# Patient Record
Sex: Male | Born: 1960 | Race: White | Hispanic: No | Marital: Married | Smoking: Never smoker
Health system: Southern US, Community
[De-identification: ages and names within clinical notes are randomized; demographics above are authoritative.]

## PROBLEM LIST (undated history)

## (undated) DIAGNOSIS — Z79899 Other long term (current) drug therapy: Secondary | ICD-10-CM

## (undated) DIAGNOSIS — I4891 Unspecified atrial fibrillation: Secondary | ICD-10-CM

## (undated) DIAGNOSIS — R0600 Dyspnea, unspecified: Secondary | ICD-10-CM

## (undated) DIAGNOSIS — E785 Hyperlipidemia, unspecified: Secondary | ICD-10-CM

## (undated) DIAGNOSIS — R7302 Impaired glucose tolerance (oral): Secondary | ICD-10-CM

## (undated) DIAGNOSIS — I2510 Atherosclerotic heart disease of native coronary artery without angina pectoris: Secondary | ICD-10-CM

## (undated) DIAGNOSIS — I10 Essential (primary) hypertension: Secondary | ICD-10-CM

## (undated) HISTORY — DX: Hyperlipidemia, unspecified: E78.5

## (undated) HISTORY — DX: Atherosclerotic heart disease of native coronary artery without angina pectoris: I25.10

## (undated) HISTORY — DX: Unspecified atrial fibrillation: I48.91

## (undated) HISTORY — PX: CHOLECYSTECTOMY: SHX55

## (undated) HISTORY — DX: Impaired glucose tolerance (oral): R73.02

## (undated) HISTORY — DX: Essential (primary) hypertension: I10

## (undated) HISTORY — DX: Dyspnea, unspecified: R06.00

## (undated) HISTORY — PX: CARDIAC CATHETERIZATION: SHX172

## (undated) HISTORY — DX: Other long term (current) drug therapy: Z79.899

---

## 2007-05-02 ENCOUNTER — Emergency Department (HOSPITAL_COMMUNITY): Admission: EM | Admit: 2007-05-02 | Discharge: 2007-05-02 | Payer: Self-pay | Admitting: Emergency Medicine

## 2007-05-02 ENCOUNTER — Ambulatory Visit: Payer: Self-pay | Admitting: Internal Medicine

## 2007-05-02 ENCOUNTER — Ambulatory Visit: Payer: Self-pay

## 2007-05-02 LAB — CONVERTED CEMR LAB
CO2: 30 meq/L (ref 19–32)
GFR calc Af Amer: 103 mL/min
GFR calc non Af Amer: 86 mL/min
Glucose, Bld: 96 mg/dL (ref 70–99)
Sodium: 142 meq/L (ref 135–145)

## 2007-05-08 ENCOUNTER — Ambulatory Visit: Payer: Self-pay | Admitting: Internal Medicine

## 2007-05-08 ENCOUNTER — Emergency Department (HOSPITAL_COMMUNITY): Admission: EM | Admit: 2007-05-08 | Discharge: 2007-05-08 | Payer: Self-pay | Admitting: Emergency Medicine

## 2007-05-10 ENCOUNTER — Ambulatory Visit: Payer: Self-pay

## 2007-05-10 ENCOUNTER — Encounter: Payer: Self-pay | Admitting: Internal Medicine

## 2007-07-28 ENCOUNTER — Ambulatory Visit: Payer: Self-pay | Admitting: Internal Medicine

## 2007-08-16 ENCOUNTER — Ambulatory Visit: Admission: RE | Admit: 2007-08-16 | Discharge: 2007-08-16 | Payer: Self-pay | Admitting: Internal Medicine

## 2007-08-16 ENCOUNTER — Ambulatory Visit: Payer: Self-pay | Admitting: Internal Medicine

## 2008-01-17 ENCOUNTER — Encounter: Admission: RE | Admit: 2008-01-17 | Discharge: 2008-01-17 | Payer: Self-pay | Admitting: Gastroenterology

## 2008-01-20 ENCOUNTER — Encounter: Admission: RE | Admit: 2008-01-20 | Discharge: 2008-01-20 | Payer: Self-pay | Admitting: Gastroenterology

## 2008-01-20 ENCOUNTER — Encounter (INDEPENDENT_AMBULATORY_CARE_PROVIDER_SITE_OTHER): Payer: Self-pay | Admitting: *Deleted

## 2008-02-01 ENCOUNTER — Ambulatory Visit (HOSPITAL_COMMUNITY): Admission: RE | Admit: 2008-02-01 | Discharge: 2008-02-01 | Payer: Self-pay | Admitting: Gastroenterology

## 2008-02-21 ENCOUNTER — Ambulatory Visit (HOSPITAL_COMMUNITY): Admission: RE | Admit: 2008-02-21 | Discharge: 2008-02-21 | Payer: Self-pay | Admitting: Gastroenterology

## 2008-02-21 ENCOUNTER — Encounter (INDEPENDENT_AMBULATORY_CARE_PROVIDER_SITE_OTHER): Payer: Self-pay | Admitting: *Deleted

## 2008-03-13 ENCOUNTER — Emergency Department (HOSPITAL_COMMUNITY): Admission: EM | Admit: 2008-03-13 | Discharge: 2008-03-14 | Payer: Self-pay | Admitting: Emergency Medicine

## 2008-04-18 ENCOUNTER — Encounter: Payer: Self-pay | Admitting: Internal Medicine

## 2008-07-05 ENCOUNTER — Emergency Department (HOSPITAL_COMMUNITY): Admission: EM | Admit: 2008-07-05 | Discharge: 2008-07-06 | Payer: Self-pay | Admitting: Emergency Medicine

## 2008-07-16 ENCOUNTER — Ambulatory Visit: Payer: Self-pay | Admitting: Internal Medicine

## 2008-08-09 ENCOUNTER — Telehealth: Payer: Self-pay | Admitting: Internal Medicine

## 2009-01-17 ENCOUNTER — Telehealth: Payer: Self-pay | Admitting: Internal Medicine

## 2009-02-16 ENCOUNTER — Emergency Department (HOSPITAL_COMMUNITY): Admission: EM | Admit: 2009-02-16 | Discharge: 2009-02-16 | Payer: Self-pay | Admitting: Emergency Medicine

## 2009-02-16 ENCOUNTER — Encounter: Payer: Self-pay | Admitting: Internal Medicine

## 2009-03-25 ENCOUNTER — Telehealth: Payer: Self-pay | Admitting: Internal Medicine

## 2009-03-28 ENCOUNTER — Telehealth: Payer: Self-pay | Admitting: Internal Medicine

## 2009-04-22 ENCOUNTER — Ambulatory Visit: Payer: Self-pay | Admitting: Professional

## 2009-04-29 ENCOUNTER — Ambulatory Visit: Payer: Self-pay | Admitting: Professional

## 2009-04-30 ENCOUNTER — Telehealth: Payer: Self-pay | Admitting: Internal Medicine

## 2009-04-30 ENCOUNTER — Ambulatory Visit: Payer: Self-pay | Admitting: Cardiology

## 2009-04-30 DIAGNOSIS — K219 Gastro-esophageal reflux disease without esophagitis: Secondary | ICD-10-CM | POA: Insufficient documentation

## 2009-04-30 DIAGNOSIS — R079 Chest pain, unspecified: Secondary | ICD-10-CM

## 2009-04-30 DIAGNOSIS — R0602 Shortness of breath: Secondary | ICD-10-CM

## 2009-04-30 DIAGNOSIS — I4891 Unspecified atrial fibrillation: Secondary | ICD-10-CM

## 2009-05-01 ENCOUNTER — Encounter: Payer: Self-pay | Admitting: Internal Medicine

## 2009-05-01 ENCOUNTER — Ambulatory Visit: Payer: Self-pay

## 2009-05-03 ENCOUNTER — Telehealth: Payer: Self-pay | Admitting: Internal Medicine

## 2009-05-06 ENCOUNTER — Ambulatory Visit: Payer: Self-pay | Admitting: Professional

## 2009-05-13 ENCOUNTER — Ambulatory Visit: Payer: Self-pay | Admitting: Professional

## 2009-05-16 ENCOUNTER — Telehealth (INDEPENDENT_AMBULATORY_CARE_PROVIDER_SITE_OTHER): Payer: Self-pay | Admitting: *Deleted

## 2009-05-16 ENCOUNTER — Encounter: Payer: Self-pay | Admitting: Internal Medicine

## 2009-05-20 ENCOUNTER — Ambulatory Visit: Payer: Self-pay | Admitting: Professional

## 2009-07-01 ENCOUNTER — Encounter (INDEPENDENT_AMBULATORY_CARE_PROVIDER_SITE_OTHER): Payer: Self-pay | Admitting: *Deleted

## 2009-08-02 ENCOUNTER — Ambulatory Visit: Payer: Self-pay | Admitting: Internal Medicine

## 2009-08-26 ENCOUNTER — Telehealth: Payer: Self-pay | Admitting: Internal Medicine

## 2010-01-17 ENCOUNTER — Encounter: Payer: Self-pay | Admitting: Internal Medicine

## 2010-01-23 ENCOUNTER — Ambulatory Visit: Payer: Self-pay | Admitting: Internal Medicine

## 2010-01-23 DIAGNOSIS — E785 Hyperlipidemia, unspecified: Secondary | ICD-10-CM

## 2010-04-28 ENCOUNTER — Emergency Department (HOSPITAL_COMMUNITY): Admission: EM | Admit: 2010-04-28 | Discharge: 2010-04-28 | Payer: Self-pay | Admitting: Emergency Medicine

## 2010-04-28 ENCOUNTER — Encounter: Admission: RE | Admit: 2010-04-28 | Discharge: 2010-04-28 | Payer: Self-pay | Admitting: Gastroenterology

## 2010-04-30 IMAGING — CR DG CHEST 2V
2 series · 2 of 2 positions shown · non-contrast
Comparison: 05/08/2007

CLINICAL DATA: Post gallbladder surgery today.  Dyspnea.

CHEST - 2 VIEW

[w chest pa]
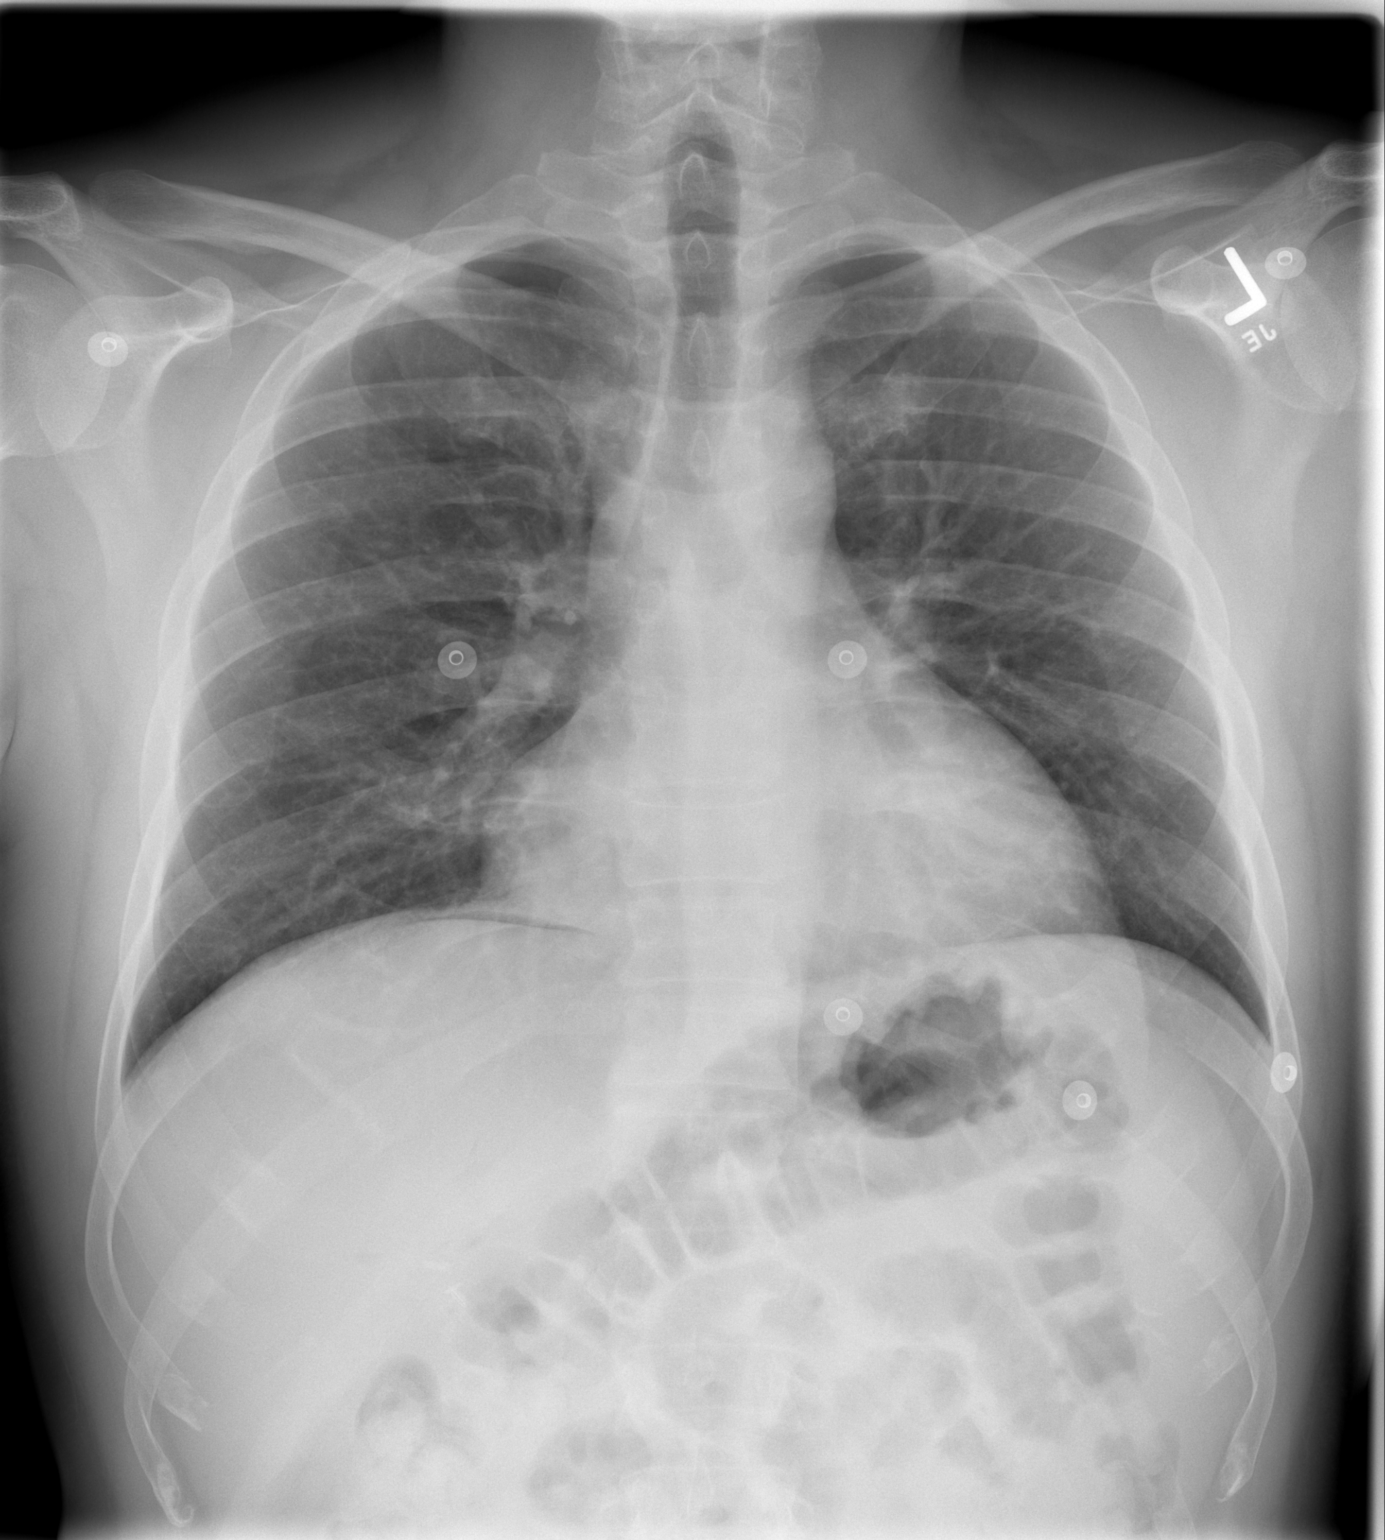

[w chest lat]
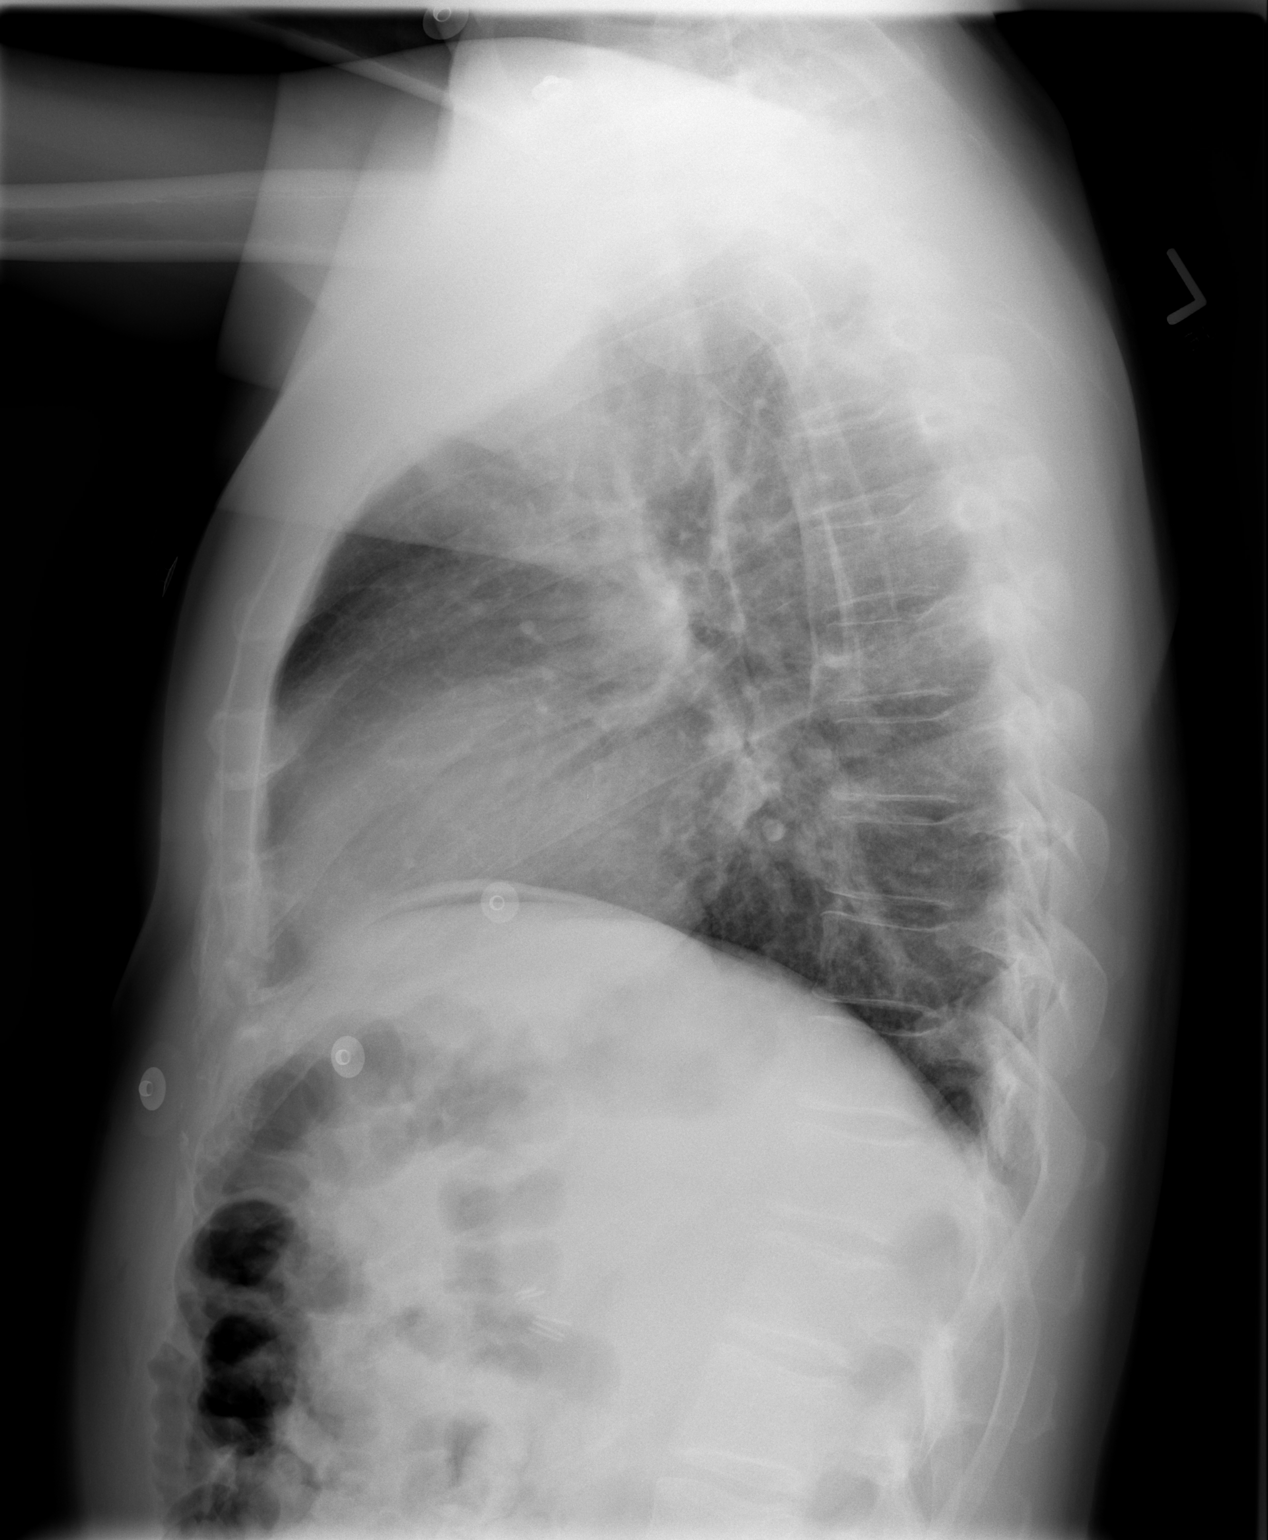

[2 of 2 positions shown; findings below may reference images not displayed]

FINDINGS: Free intraperitoneal air is identified under the right
hemidiaphragm.  This is consistent with the history of gallbladder
surgery earlier today. Midline trachea. Borderline cardiomegaly.
Normal mediastinal contours. No pleural effusion or pneumothorax.
Clear lungs. Bowel gas pattern is normal limits in its visualized
portions.
IMPRESSION: 1.  Borderline cardiomegaly but no acute cardiopulmonary disease.
2.  Free intraperitoneal air, likely related to the history of
gallbladder surgery today.  Clinically correlate.

## 2010-05-21 ENCOUNTER — Encounter: Payer: Self-pay | Admitting: Internal Medicine

## 2010-05-28 ENCOUNTER — Encounter: Payer: Self-pay | Admitting: Internal Medicine

## 2010-06-12 ENCOUNTER — Encounter: Payer: Self-pay | Admitting: Internal Medicine

## 2010-06-18 ENCOUNTER — Ambulatory Visit: Payer: Self-pay | Admitting: Diagnostic Radiology

## 2010-06-18 ENCOUNTER — Ambulatory Visit: Payer: Self-pay | Admitting: Cardiology

## 2010-06-18 ENCOUNTER — Inpatient Hospital Stay (HOSPITAL_COMMUNITY): Admission: EM | Admit: 2010-06-18 | Discharge: 2010-06-20 | Payer: Self-pay | Admitting: Internal Medicine

## 2010-06-20 ENCOUNTER — Encounter: Payer: Self-pay | Admitting: Internal Medicine

## 2010-06-30 ENCOUNTER — Ambulatory Visit: Payer: Self-pay | Admitting: Internal Medicine

## 2010-07-07 ENCOUNTER — Encounter: Payer: Self-pay | Admitting: Internal Medicine

## 2010-07-10 ENCOUNTER — Encounter: Payer: Self-pay | Admitting: Internal Medicine

## 2010-08-01 ENCOUNTER — Encounter: Payer: Self-pay | Admitting: Physician Assistant

## 2010-08-08 ENCOUNTER — Ambulatory Visit: Payer: Self-pay | Admitting: Cardiology

## 2010-08-08 ENCOUNTER — Encounter: Payer: Self-pay | Admitting: Physician Assistant

## 2010-08-08 DIAGNOSIS — E739 Lactose intolerance, unspecified: Secondary | ICD-10-CM

## 2010-08-08 DIAGNOSIS — I2510 Atherosclerotic heart disease of native coronary artery without angina pectoris: Secondary | ICD-10-CM | POA: Insufficient documentation

## 2010-09-01 ENCOUNTER — Telehealth: Payer: Self-pay | Admitting: Internal Medicine

## 2010-09-04 ENCOUNTER — Telehealth (INDEPENDENT_AMBULATORY_CARE_PROVIDER_SITE_OTHER): Payer: Self-pay | Admitting: *Deleted

## 2010-09-05 ENCOUNTER — Encounter: Payer: Self-pay | Admitting: Internal Medicine

## 2010-09-09 ENCOUNTER — Telehealth (INDEPENDENT_AMBULATORY_CARE_PROVIDER_SITE_OTHER): Payer: Self-pay | Admitting: *Deleted

## 2010-09-13 ENCOUNTER — Encounter: Payer: Self-pay | Admitting: Internal Medicine

## 2010-09-18 ENCOUNTER — Telehealth: Payer: Self-pay | Admitting: Internal Medicine

## 2010-10-01 ENCOUNTER — Ambulatory Visit: Payer: Self-pay | Admitting: Internal Medicine

## 2010-10-15 ENCOUNTER — Encounter (INDEPENDENT_AMBULATORY_CARE_PROVIDER_SITE_OTHER): Payer: Self-pay | Admitting: *Deleted

## 2010-10-28 NOTE — Assessment & Plan Note (Signed)
Summary: EPH/JML   Primary Provider:  [facility] [address]  CC:  eph/ pt states that he is doing well.  Pt feels all issues have resolved and he is here just to follow up. Marland Kitchen  History of Present Illness: Terry Guzman he is a 50 year old male with a history of atrial fibrillation on flecainide therapy who was recently admitted with chest pain.  He ruled out for myocardial infarction.  His cardiac catheterization showed mild, nonobstructive disease.  His ejection fraction was normal.  He returns today for post hospitalization followup.  Since discharge, he denies chest pain, dyspnea, orthopnea, PND, pedal edema or syncope.  The patient describes NYHA Class 1 symptoms.   Current Medications (verified): 1)  Metoprolol Succinate 25 Mg Xr24h-Tab (Metoprolol Succinate) .... Take One Tablet By Mouth Daily 2)  Flecainide Acetate 50 Mg Tabs (Flecainide Acetate) .... Take One Tablet By Mouth Every 12 Hours 3)  Nexium 40 Mg Cpdr (Esomeprazole Magnesium) .Marland Kitchen.. 1 Tab Once Daily 4)  Pravastatin Sodium 40 Mg Tabs (Pravastatin Sodium) .... Take One Tablet By Mouth Daily At Bedtime 5)  Fish Oil 1200 Mg Caps (Omega-3 Fatty Acids) .... Take 1 Capsule By Mouth Two Times A Day 6)  Aspirin 81 Mg Tbec (Aspirin) .... Take One Tablet By Mouth Daily  Allergies (verified): 1)  ! Levaquin 2)  ! * Clarithromycin  Past History:  Past Medical History: CHEST PAIN-UNSPECIFIED (ICD-786.50)   a.  [facility] 05/2010:  Cath with nonobs CAD (LAD 40%/30%); Normal LVF - EF > 50% ATRIAL FIBRILLATION (ICD-427.31)   a.  Flecainide therapy Dyspnea Hyperlipidemia Glucose intolerant (A1C 6.2 in hosp in 05/2010)  Past Surgical History: cholecystectomy cardiac cath 05/2010  Vital Signs:  Patient profile:   50 year old male Height:      67 inches Weight:      200 pounds Temp:     97 degrees F oral Pulse rate:   69 / minute Pulse rhythm:   regular BP sitting:   129 / 79  (left arm) Cuff size:   regular  Vitals  Entered By: Judithe Modest CMA (August 08, 2010 10:28 AM)  Physical Exam  General:  Well nourished, well developed, in no acute distress HEENT: normal Neck: no JVD Cardiac:  normal S1, S2; RRR; no murmur Lungs:  clear to auscultation bilaterally, no wheezing, rhonchi or rales Abd: soft, nontender, no hepatomegaly Ext: no edema; right radial site without hematoma or bruit Vascular: no carotid  bruits Skin: warm and dry Neuro:  CNs 2-12 intact, no focal abnormalities noted    EKG  Procedure date:  08/08/2010  Findings:      Normal sinus rhythm Heart rate 69 Normal axis No ischemic changes  Impression & Recommendations:  Problem # 1:  CORONARY ATHEROSCLEROSIS NATIVE CORONARY ARTERY (ICD-414.01) Nonobstructive disease on recent cath. Continue risk factor modification. Continue ASA.  Problem # 2:  ATRIAL FIBRILLATION (ICD-427.31)  Continue on Flecainide and Toprol.  Problem # 3:  HYPERLIPIDEMIA-MIXED (ICD-272.4)  Going to Lipid clinic. No myalgias with pravastatin. Just had labs drawn . . no results in system yet (drawn at PCPs office).  Problem # 4:  CHEST PAIN-UNSPECIFIED (ICD-786.50) Resolved. Noncardiac.  Problem # 5:  GLUCOSE INTOLERANCE (ICD-271.3) F/u with PCP   Appended Document: [facility]     Allergies: 1)  ! Levaquin 2)  ! * Clarithromycin   Other Orders: EKG w/ Interpretation (93000)

## 2010-10-28 NOTE — Progress Notes (Signed)
Summary: Faxed records to [facility].  Faxed records to [facility]. LOV & EKG Fax: [phone] Phone: [phone] Marylou Mccoy  September 04, 2010 12:44 PM

## 2010-10-28 NOTE — Progress Notes (Signed)
Summary: questions about meds  Phone Note Call from Patient Call back at Home Phone [phone] Call back at Work Phone [phone]   Caller: Patient Summary of Call: Pt calling with questions about medication Initial call taken by: Judie Grieve,  September 01, 2010 9:47 AM  Follow-up for Phone Call        [facility] at work for call back.  Layne Benton, RN, BSN  September 01, 2010 11:52 AM  PT Nicoletta Dress -[place] CALL [phone] Glynda Jaeger  September 01, 2010 12:17 PPt returning call Judie Grieve  September 01, 2010 1:26 PM  Additional Follow-up for Phone Call Additional follow up Details #1::        Called patient back and he states that for a long time he has been having an increased heart rate and shortness of breath when walking up a hill. He states that he thinks his heart rate goes at about 100 per minute and is not sure if he goes out of rhythm. He states that he read the possible side effects of Flecainide and both SOB and increasd heart rate are listed. Advised I would discuss with Dr.Ross and call him back, he states that he has not used a monitor in over 1 year.     Layne Benton, RN, BSN  September 01, 2010 4:16 PM     Additional Follow-up for Phone Call Additional follow up Details #2::    Called patient in follow up and [facility] that Dr.Ross is discussing the above issues with Dr.Taylor and then we will get back with him.  Layne Benton, RN, BSN  September 03, 2010 5:46 PM   Additional Follow-up for Phone Call Additional follow up Details #3:: Details for Additional Follow-up Action Taken: Set up for event monitor. Additional Follow-up by: Sherrill Raring, MD, [facility],  September 04, 2010 10:24 PM   Appended Document: questions about meds [facility] with above information.

## 2010-10-28 NOTE — Letter (Signed)
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

## 2010-10-28 NOTE — Assessment & Plan Note (Signed)
Summary: ROV/CHEST PAINS EKG CHANGES-MB   Visit Type:  Follow-up Primary Provider:  [facility] [address]  CC:  sob-chol is up-.  History of Present Illness:  Mr. Bodie is a 50 year old with a history of intermittent atrial fibrillation and shortness of breath. I last saw him in clinic in the winter.   [facility]  he told them about some dyspnea after exertion and they recommended that he f/u with his cardiologist. He reports that he has not been running for the past 4 months because of an injury.  He started back about 1 1/2 wks ago.  He is running about 2 miles.  He says at the end he feels more short of breath than he has been in the past.  He's actually felt a little weak kneed but has not passed out.  No chest pain.  No problems that have stopped him during the run.   In regards to his atrial fibrillation he has not had any problems with palpitations. Note that [facility].  LDL was 143, HDL was 42.  trig were 126.  this was not significantly changed from 1 year ago per his reprot.  He has tried to [employer] his diet.  On talking to him he does not appear to eat much fat.  The [facility] physician has asked him to come back today to discuss the results.  Current Medications (verified): 1)  Metoprolol Succinate 25 Mg Xr24h-Tab (Metoprolol Succinate) .... Take One Tablet By Mouth Daily 2)  Flecainide Acetate 50 Mg Tabs (Flecainide Acetate) .... Take One Tablet By Mouth Every 12 Hours 3)  Nexium 40 Mg Cpdr (Esomeprazole Magnesium) .Marland Kitchen.. 1 Tab Once Daily  Allergies (verified): No Known Drug Allergies  Past History:  Family History: Last updated: 04/30/2009  Paternal grandfathers on both side had coronary artery   disease and died in their 17's.   Social History: Last updated: 04/30/2009  1. He works for the [employer] and recently relocated to [place].   2. Does not smoke.   3. Drinks one alcoholic beverage per day.   4. No  drugs.      Past medical, surgical, family and social histories (including risk factors) reviewed, and no changes noted (except as noted below).  Past Medical History: Reviewed history from 08/02/2009 and no changes required. CHEST PAIN-UNSPECIFIED (ICD-786.50) ATRIAL FIBRILLATION (ICD-427.31) Dyspnea  Past Surgical History: Reviewed history from 04/30/2009 and no changes required. cholecystectomy  Family History: Reviewed history from 04/30/2009 and no changes required.  Paternal grandfathers on both side had coronary artery   disease and died in their 71's.   Social History: Reviewed history from 04/30/2009 and no changes required.  1. He works for the [employer] and recently relocated to [place].   2. Does not smoke.   3. Drinks one alcoholic beverage per day.   4. No drugs.      Review of Systems       All systmes reviewed.  Did bring in labs as noted.  Vital Signs:  Patient profile:   50 year old male Height:      67 inches Weight:      196 pounds Pulse rate:   56 / minute BP sitting:   123 / 80  (left arm) Cuff size:   regular  Vitals Entered By: Burnett Kanaris, CNA (January 23, 2010 2:07 PM)  Physical Exam  Additional Exam:  Patient is in NAD. HEENT:  Normocephalic, atraumatic. EOMI, PERRLA.  Neck: JVP is normal. No thyromegaly. No bruits.  Lungs: clear to auscultation. No rales no wheezes.  Heart: Regular rate and rhythm. Normal S1, S2. No S3.   No significant murmurs. PMI not displaced.  Abdomen:  Supple, nontender. Normal bowel sounds. No masses. No hepatomegaly.  Extremities:   Good distal pulses throughout. No lower extremity edema.  Musculoskeletal :moving all extremities.  Neuro:   alert and oriented x3.    EKG  Procedure date:  01/23/2010  Findings:      NSR>  62 bpm.  Impression & Recommendations:  Problem # 1:  CHEST PAIN-UNSPECIFIED (ICD-786.50) Patient is not having chest pain.  I do not think his shortness of breath is an anginal  equivalent.  I do instead think it is due to some deconditioning.  I have encouraged him to back off some and slow down.  If, in a couple months he does not have improvement will reassess.  Note that the patient has had SOB with stairs even at times when he was training for a marathon.   He had an normal cath in 2002 and a normal myoview in 2008.  Also evaluted in [place] since.  Problem # 2:  ATRIAL FIBRILLATION (ICD-427.31) Doing well on current regimen. His updated medication list for this problem includes:    Metoprolol Succinate 25 Mg Xr24h-tab (Metoprolol succinate) .Marland Kitchen... Take one tablet by mouth daily    Flecainide Acetate 50 Mg Tabs (Flecainide acetate) .Marland Kitchen... Take one tablet by mouth every 12 hours  Orders: EKG w/ Interpretation (93000)  Problem # 3:  HYPERLIPIDEMIA-MIXED (ICD-272.4) Reasonable with family history to start a statin.  I would recomm Zocor 20 mg.  Labs in 8 wks.  [facility] to [facility] as he would get Rx and labs there.

## 2010-10-28 NOTE — Miscellaneous (Signed)
  Clinical Lists Changes  Observations: Added new observation of CARDCATHFIND: CONCLUSIONS: 1. Well-preserved left ventricular function with ejection fraction in     excess of 50%. 2. Mild coronary irregularities with about 20-30% ostial narrowing of     the circumflex, and some mid LAD abnormalities just at the     bifurcation as noted above.   PLAN:  The patient will be taken to the 2500 unit for convalescent care where he will be ambulated.  Further evaluation per Dr. Daleen Squibb.     (06/20/2010 10:07)      Cardiac Cath  Procedure date:  06/20/2010  Findings:      CONCLUSIONS: 1. Well-preserved left ventricular function with ejection fraction in     excess of 50%. 2. Mild coronary irregularities with about 20-30% ostial narrowing of     the circumflex, and some mid LAD abnormalities just at the     bifurcation as noted above.   PLAN:  The patient will be taken to the 2500 unit for convalescent care where he will be ambulated.  Further evaluation per Dr. Daleen Squibb.

## 2010-10-28 NOTE — Miscellaneous (Signed)
  Clinical Lists Changes  Orders: Added new Referral order of Lipid Clinic (Lipid Clinic) - Signed Added new Referral order of Misc. Referral (Misc. Ref) - Signed

## 2010-10-28 NOTE — Miscellaneous (Signed)
  Clinical Lists Changes  Orders: Added new Referral order of Lipid Clinic (Lipid Clinic) - Signed

## 2010-10-28 NOTE — Medication Information (Signed)
Summary: Physician's Orders   Physician's Orders   Imported By: Roderic Ovens 07/17/2010 10:15:25  _____________________________________________________________________  External Attachment:    Type:   Image     Comment:   External Document

## 2010-10-28 NOTE — Letter (Signed)
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

## 2010-10-28 NOTE — Assessment & Plan Note (Signed)
Summary: lipid/272.4/saf   Visit Type:  Initial Consult Primary Provider:  [facility] [address]   History of Present Illness:       This is a 50 year old male who presents for evaluation in Lipid Clinic.  The patient presents with history of hyperlipidemia, intolerance to multiple cholesterol medications, and initial cholesterol value of: TC- 252, TG- 179, HDL-46, and LDL- 170 (labs drawn 9/1).  Previous lipid medications have included Crestor and pravastatin- muscle pains, Zetia- diarrhea, and Tricor.  Of note, he was hospitalized recently for CP.  Cardiac Cath showed non-obstructive CAD.  His cholesterol panel at that time showed TC- 188, TG- 156, HDL-32 and LDL 126.  He had been on Tricor about 2 weeks prior to this hospitalization.  At discharge, he was changed to pravastatin 40mg  again.  He has tolerated this for about a week with no muscle pains.  Pertinent family history includes dad with MI at 57 and grandparents and uncles with heart disease in their 86s.         Review of diet and exercise habits reveals that the patient is limiting fats and TFA's and exercising 3-5 times a week.  He does not eat breakfast on a regular basis but will eat eggs and hashbrowns once a week.  For lunch he often eats [place] or chicken sandwiches on rye or wheat bread.  Dinner consists of grilled/baked chicken, pork or fish.  He cannot eat red meat because it upsets his stomach.  He does eat lots of vegetables but unable to tolerate ones high in fiber such as brocolli or calliflower due to upset stomach.  He snacks on potatoe chips occassionally.  He has had his gallbladder removed and is lactacte intolerate.  He did admit that his wife was out of town a lot in August so he ate more fast foods.  Pt is consistently exercising.  He was a marathon runner in the past but cannot tolerate exercising as much now due to the afib.  He started running again about 1 month ago.  He runs 2-3 miles (9-10 min miles) 3-4 times a  week.  He does not smoke or drink.    Lipid Management Provider  Weston Brass, PharmD  Preventive Screening-Counseling & Management  Alcohol-Tobacco     Alcohol drinks/day: 0     Smoking Status: never  Current Medications (verified): 1)  Metoprolol Succinate 25 Mg Xr24h-Tab (Metoprolol Succinate) .... Take One Tablet By Mouth Daily 2)  Flecainide Acetate 50 Mg Tabs (Flecainide Acetate) .... Take One Tablet By Mouth Every 12 Hours 3)  Nexium 40 Mg Cpdr (Esomeprazole Magnesium) .Marland Kitchen.. 1 Tab Once Daily 4)  Pravastatin Sodium 40 Mg Tabs (Pravastatin Sodium) .... Take One Tablet By Mouth Daily At Bedtime 5)  Fish Oil 1200 Mg Caps (Omega-3 Fatty Acids) .... Take 1 Capsule By Mouth Two Times A Day 6)  Aspirin 81 Mg Tbec (Aspirin) .... Take One Tablet By Mouth Daily  Allergies: No Known Drug Allergies  Past History:  Past Medical History: Last updated: 08/02/2009 CHEST PAIN-UNSPECIFIED (ICD-786.50) ATRIAL FIBRILLATION (ICD-427.31) Dyspnea  Family History: Last updated: 04/30/2009  Paternal grandfathers on both side had coronary artery   disease and died in their 24's.   Social History: Alcohol drinks/day:  0   Vital Signs:  Patient profile:   50 year old male Weight:      190.50 pounds BMI:     29.94 Pulse rate:   60 / minute BP sitting:  110 / 80  Impression & Recommendations:  Problem # 1:  HYPERLIPIDEMIA-MIXED (ICD-272.4) Assessment New Pt cholesterol panel from hospital: TC- 188 (goal<200), TG- 152 (goal<150), HDL- 32 (goal>40), and LDL- 126 (goal<70).  Pt has a long history of intolerance to medications.  Discussed importance of keeping LDL at goal given his hx of CP and CAD.  Pt is willing to continue to try pravastatin at this time.  Suggested he try CoQ 10 if he starts to experience any muscle pains.  He will continue fish oil 1200mg  two times a day as well.  His diet is very low in fats and carbohydrates.  TG may have been slightly increased due to increase in fast  foods.  Encouraged him to continue to exercise.  Will recheck lipid panel in 1 month.    His updated medication list for this problem includes:    Pravastatin Sodium 40 Mg Tabs (Pravastatin sodium) .Marland Kitchen... Take one tablet by mouth daily at bedtime  Patient Instructions: 1)  Continue Pravastatin 40mg  once daily. 2)  If you have muscle pains, you can try Coenzyme Q 10 that is over the counter.  3)  Continue low-fat, low-cholesterol diet 4)  Continue to exercise at least 30 minutes most days of the week 5)  We will call you about your labwork 6)  Lipid Clinic Appt: 08/04/10 at 4pm

## 2010-10-30 NOTE — Miscellaneous (Signed)
  Clinical Lists Changes  Orders: Added new Referral order of Event (Event) - Signed

## 2010-10-30 NOTE — Progress Notes (Signed)
Summary: refill med  Phone Note Refill Request Message from:  Patient on September 18, 2010 3:30 PM  Refills Requested: Medication #1:  METOPROLOL SUCCINATE 25 MG XR24H-TAB Take one tablet by mouth daily [facility] [place] [phone] [phone]   Method Requested: Fax to Local Pharmacy Initial call taken by: Lorne Skeens,  September 18, 2010 3:30 PM  Follow-up for Phone Call        RX sent into pharmacy. Pt notified.  Marrion Coy, CNA  September 18, 2010 4:08 PM  Follow-up by: Marrion Coy, CNA,  September 18, 2010 4:08 PM    Prescriptions: METOPROLOL SUCCINATE 25 MG XR24H-TAB (METOPROLOL SUCCINATE) Take one tablet by mouth daily  #30 Tablet x 6   Entered by:   Marrion Coy, CNA   Authorized by:   Sherrill Raring, MD, [facility]   Signed by:   Marrion Coy, CNA on 09/18/2010   Method used:   [facility] to        [facility]  [address] [phone][facility] (retail)       [address] [place], [place]  [address]       Ph: [phone] or [phone]       Fax: [phone]   RxID:   [id number]

## 2010-10-30 NOTE — Progress Notes (Addendum)
Summary: monitor  Phone Note Outgoing Call   Call placed by: Marcos Eke,  September 09, 2010 8:50 AM Summary of Call: Call patient for monitor,and patient would like monitor mail out to him Initial call taken by: Marcos Eke,  September 09, 2010 8:51 AM     Appended Document: monitor LMOM at private work phone that event monitor showed no atrial fib. Keep appointment with Dr.Ross on 11/14/2010.

## 2010-10-30 NOTE — Letter (Signed)
Summary: Appointment - Reschedule  [facility], Main Office  [address]   [place], [place] [address]   Phone: [phone]  Fax: [phone]     October 15, 2010 MRN: [id number]   RYAN OGBORN [address] [place], [place]  [address]   Dear Mr. ASPER,   Due to a change in our office schedule, your appointment on 11/13/10  at  3:30 must be changed.  It is very important that we reach you to reschedule this appointment. We look forward to participating in your health care needs. Please contact us at the number listed above at your earliest convenience to reschedule this appointment.     Sincerely, Control and instrumentation engineer

## 2010-11-13 NOTE — Procedures (Signed)
Summary: Summary Report  Summary Report   Imported By: Erle Crocker 11/06/2010 15:41:02  _____________________________________________________________________  External Attachment:    Type:   Image     Comment:   External Document

## 2010-11-14 ENCOUNTER — Ambulatory Visit: Payer: Self-pay | Admitting: Internal Medicine

## 2010-12-11 LAB — BASIC METABOLIC PANEL
Chloride: 103 mEq/L (ref 96–112)
GFR calc Af Amer: >60 mL/min (ref >60)
GFR calc Af Amer: >60 mL/min (ref >60)
GFR calc non Af Amer: >60 mL/min (ref >60)
GFR calc non Af Amer: >60 mL/min (ref >60)
Potassium: 3.6 mEq/L (ref 3.5–5.1)
Potassium: 4.2 mEq/L (ref 3.5–5.1)
Sodium: 138 mEq/L (ref 135–145)

## 2010-12-11 LAB — CARDIAC PANEL(CRET KIN+CKTOT+MB+TROPI)
Relative Index: 0.6 (ref 0.0–2.5)
Relative Index: 1.2 (ref 0.0–2.5)
Total CK: 170 U/L (ref 7–232)
Troponin I: 0.01 ng/mL (ref 0.00–0.06)
Troponin I: 0.01 ng/mL (ref 0.00–0.06)

## 2010-12-11 LAB — COMPREHENSIVE METABOLIC PANEL
ALT: 13 U/L (ref 0–53)
AST: 25 U/L (ref 0–37)
Albumin: 3.7 g/dL (ref 3.5–5.2)
BUN: 9 mg/dL (ref 6–23)
CO2: 28 mEq/L (ref 19–32)
Chloride: 106 mEq/L (ref 96–112)
GFR calc non Af Amer: >60 mL/min (ref >60)
Potassium: 4.1 mEq/L (ref 3.5–5.1)
Sodium: 140 mEq/L (ref 135–145)
Total Bilirubin: 1.1 mg/dL (ref 0.3–1.2)
Total Protein: 6.1 g/dL (ref 6.0–8.3)

## 2010-12-11 LAB — DIFFERENTIAL
Eosinophils Relative: 2 % (ref 0–5)
Lymphocytes Relative: 31 % (ref 12–46)
Lymphs Abs: 1.9 10*3/uL (ref 0.7–4.0)
Neutro Abs: 3.7 10*3/uL (ref 1.7–7.7)

## 2010-12-11 LAB — LIPID PANEL
Cholesterol: 188 mg/dL (ref 0–200)
HDL: 32 mg/dL — ABNORMAL LOW (ref >39)
LDL Cholesterol: 126 mg/dL — ABNORMAL HIGH (ref 0–99)
Total CHOL/HDL Ratio: 5.9 RATIO
Triglycerides: 152 mg/dL — ABNORMAL HIGH (ref <150)

## 2010-12-11 LAB — CBC
HCT: 45.4 % (ref 39.0–52.0)
MCH: 29.7 pg (ref 26.0–34.0)
MCHC: 35.7 g/dL (ref 30.0–36.0)
MCV: 82.9 fL (ref 78.0–100.0)
MCV: 85.9 fL (ref 78.0–100.0)
Platelets: 285 10*3/uL (ref 150–400)
RBC: 5.16 MIL/uL (ref 4.22–5.81)
RBC: 5.28 MIL/uL (ref 4.22–5.81)
WBC: 5.8 10*3/uL (ref 4.0–10.5)
WBC: 6.1 10*3/uL (ref 4.0–10.5)

## 2010-12-11 LAB — POCT CARDIAC MARKERS
CKMB, poc: <1 ng/mL — ABNORMAL LOW (ref 1.0–8.0)
Troponin i, poc: <0.05 ng/mL (ref 0.00–0.09)

## 2010-12-11 LAB — D-DIMER, QUANTITATIVE: D-Dimer, Quant: <0.22 ug/mL-FEU (ref 0.00–0.48)

## 2010-12-11 LAB — HEMOGLOBIN A1C: Hgb A1c MFr Bld: 6.2 % — ABNORMAL HIGH (ref <5.7)

## 2010-12-12 LAB — BASIC METABOLIC PANEL
BUN: 8 mg/dL (ref 6–23)
Calcium: 9.6 mg/dL (ref 8.4–10.5)
Creatinine, Ser: 1.21 mg/dL (ref 0.4–1.5)
GFR calc non Af Amer: >60 mL/min (ref >60)
Glucose, Bld: 100 mg/dL — ABNORMAL HIGH (ref 70–99)
Potassium: 4.1 mEq/L (ref 3.5–5.1)

## 2010-12-12 LAB — DIFFERENTIAL
Basophils Absolute: 0 10*3/uL (ref 0.0–0.1)
Basophils Relative: 0 % (ref 0–1)
Lymphocytes Relative: 26 % (ref 12–46)
Neutro Abs: 3.3 10*3/uL (ref 1.7–7.7)
Neutrophils Relative %: 69 % (ref 43–77)

## 2010-12-12 LAB — URINALYSIS, ROUTINE W REFLEX MICROSCOPIC
Bilirubin Urine: NEGATIVE
Ketones, ur: NEGATIVE mg/dL
Nitrite: NEGATIVE
Protein, ur: NEGATIVE mg/dL
Urobilinogen, UA: 0.2 mg/dL (ref 0.0–1.0)
pH: 7 (ref 5.0–8.0)

## 2010-12-12 LAB — CBC
MCHC: 34.8 g/dL (ref 30.0–36.0)
Platelets: 252 10*3/uL (ref 150–400)
RDW: 12.6 % (ref 11.5–15.5)
WBC: 4.8 10*3/uL (ref 4.0–10.5)

## 2010-12-15 ENCOUNTER — Ambulatory Visit: Payer: Self-pay | Admitting: Internal Medicine

## 2011-01-06 LAB — DIFFERENTIAL
Basophils Absolute: 0 10*3/uL (ref 0.0–0.1)
Basophils Relative: 0 % (ref 0–1)
Eosinophils Absolute: 0 10*3/uL (ref 0.0–0.7)
Monocytes Absolute: 0.6 10*3/uL (ref 0.1–1.0)
Monocytes Relative: 6 % (ref 3–12)
Neutro Abs: 9 10*3/uL — ABNORMAL HIGH (ref 1.7–7.7)

## 2011-01-06 LAB — COMPREHENSIVE METABOLIC PANEL
ALT: 133 U/L — ABNORMAL HIGH (ref 0–53)
Albumin: 4.4 g/dL (ref 3.5–5.2)
Alkaline Phosphatase: 141 U/L — ABNORMAL HIGH (ref 39–117)
BUN: 9 mg/dL (ref 6–23)
Chloride: 102 mEq/L (ref 96–112)
Glucose, Bld: 105 mg/dL — ABNORMAL HIGH (ref 70–99)
Potassium: 4.5 mEq/L (ref 3.5–5.1)
Sodium: 139 mEq/L (ref 135–145)
Total Bilirubin: 2.1 mg/dL — ABNORMAL HIGH (ref 0.3–1.2)
Total Protein: 7.6 g/dL (ref 6.0–8.3)

## 2011-01-06 LAB — CBC
HCT: 48.8 % (ref 39.0–52.0)
Hemoglobin: 16.8 g/dL (ref 13.0–17.0)
RDW: 12.8 % (ref 11.5–15.5)
WBC: 10.7 10*3/uL — ABNORMAL HIGH (ref 4.0–10.5)

## 2011-01-24 ENCOUNTER — Encounter: Payer: Self-pay | Admitting: Internal Medicine

## 2011-01-26 ENCOUNTER — Ambulatory Visit: Payer: Self-pay | Admitting: Internal Medicine

## 2011-02-05 ENCOUNTER — Other Ambulatory Visit: Payer: Self-pay | Admitting: Cardiovascular Disease

## 2011-02-10 NOTE — Consult Note (Signed)
Terry Guzman, Terry Guzman           ACCOUNT NO.:  [id number]   MEDICAL RECORD NO.:  [id number]          PATIENT TYPE:  EMS   LOCATION:  ED                           FACILITY:  [facility]   PHYSICIAN:  Bevelyn Buckles. Bensimhon, MDDATE OF BIRTH:  07/13/1961   DATE OF CONSULTATION:  05/08/2007  DATE OF DISCHARGE:                                 CONSULTATION   CARDIOLOGIST:  Dr. Dietrich Pates.   REASON FOR CONSULT:  Dyspnea on exertion and chest pain.   HISTORY OF PRESENT ILLNESS:  Terry Guzman is a 50 year old male runner  with a history of paroxysmal atrial fibrillation and gastroesophageal  reflux disease.  Apparently, he underwent routine stress test in 2002 in  [place] in which they saw some mild abnormality and subsequently  underwent cardiac catheterization in which he had clean coronary  arteries.  In 2006, he developed atrial fibrillation during a marathon  and was converted with an IV medication.  In April of 2007, again  he  had another episode of atrial fibrillation and he was started on  flecainide and Toprol.  He has done well since that time.   He recently transferred jobs from [place] and moved to [place] on  June 1st.  Over the past month or two he has noticed dyspnea on  exertion.  He says that any time he tries to go up steps he feels that  he is winded and that his heart rate recovers slowly.  He also notes  occasional episodes of a pressure or funny-like feeling in his left  shoulder and arm.  He has also tried to run and he said that he usually  runs 5 miles but now he feels himself quite winded after 3 miles.   He saw Dr. Tenny Craw in the office on the 4th of August and she checked some  labs including a TSH which was normal as well as an EKG.  She increased  his Nexium to b.i.d. and was going to put a cardiac monitor on him.  However, he has continued to have shortness of breath with walking up  steps and so he presented to the ER for evaluation.  He is not having  any rest  symptoms.   REVIEW OF SYSTEMS:  He has had 3 days of diarrhea, which is now  improving, and also had some chills this morning but no cough or  productive sputum.  He has not had any lower extremity edema, orthopnea  or PND.  He has not noticed any melena or bright red blood per rectum.  The remainder of the review of systems is negative except for HPI and  Problem List.   PROBLEM LIST:  1. Paroxysmal atrial fibrillation, maintained on flecainide and      Toprol.  2. History of normal coronary arteries by catheterization in 2002.  3. Gastroesophageal reflux disease.   CURRENT MEDICATIONS:  1. Flecainide 50 b.i.d.  2. Nexium 40 b.i.d.  3. Toprol-XL 25 a day.   ALLERGIES:  No known drug allergies.   FAMILY HISTORY:  Paternal grandfathers on both side had coronary artery  disease and died in their  60's.   SOCIAL HISTORY:  1. He works for the [employer] and recently relocated to [place].  2. Does not smoke.  3. Drinks one alcoholic beverage per day.  4. No drugs.   PHYSICAL EXAM:  He is a bit anxious appearing but otherwise in no acute  distress.  Blood pressure is 118/71, heart rate 64, his respiratory rate  is 15, the respirations are unlabored.  He is sating 98% on room air.  HEENT:  Normal.  NECK:  Supple, no JVD.  Carotids are 2+ bilaterally without any bruits.  There is no lymphadenopathy or thyromegaly.  CARDIAC:  He is bradycardic and regular with no murmurs, rubs or  gallops.  The PMI is normal.  LUNGS:  Clear.  ABDOMEN:  Soft, nontender, nondistended.  No hepatosplenomegaly, no  bruits, no masses.  Good bowel sounds.  EXTREMITIES:  Warm with no cyanosis, clubbing or edema, good distal  pulses.  No rash.  NEURO:  He is alert and oriented x3.  Cranial nerves II-XII are intact,  moves all 4 extremities without difficulty.  Affect, as I said, is a bit  anxious but otherwise normal.   Cardiac markers show a CK MB of less than 1, myoglobin of 34 and  troponin I of less  than 0.05.  EKG shows sinus bradycardia at a rate of  57 with no ST-T wave abnormalities.  TSH is normal.  BMET is normal with  a potassium of 4.6 and a creatinine of 1.   ASSESSMENT:  1. Dyspnea on exertion and chest pain without any overt evidence of      ischemia or congestive heart failure.  2. History of gastroesophageal reflux disease.  3. History of normal coronary arteries by catheterization in 2002.  4. History of paroxysmal atrial fibrillation.   PLAN:  I am not sure what the cause of his symptoms are.  We will check  a BNP, a CBC and a D-dimer and another set of cardiac enzymes.  If these  are negative, we will discharge him home with an outpatient Myoview and  echocardiogram in the next 24-48 hours.  I told him if he develops  resting symptoms he is to report back to the emergency room for  admission and further evaluation.  I have also recommended that he start  baby aspirin.      Bevelyn Buckles. Bensimhon, MD  Electronically Signed     DRB/MEDQ  D:  05/08/2007  T:  05/08/2007  Job:  [id number]

## 2011-02-10 NOTE — Assessment & Plan Note (Signed)
[facility]                            CARDIOLOGY OFFICE NOTE   NAME:Terry Guzman, Terry Guzman                    MRN:          [id number]  DATE:07/28/2007                            DOB:          08/25/1961    IDENTIFICATION:  Patient is a 50 year old gentleman who was last seen in  Cardiology clinic back in early August.  He has a history of atrial  fibrillation treated in [place].  He was placed on flecainide and  Toprol in 2007.   When I saw him he was having some epigastric gassy sensation which I was  not convinced was cardiac.  The patient continued to have some episodes  and actually went to the emergency room and was evaluated.  He was set  up for an outpatient Myoview, this was done on August 12.  The patient  exercised for 12 minutes to a peak heart rate of 157 which was 90%  predicted, stopped because of fatigue, denied chest pain.  EKG was  negative.  Myoview scan showed no evidence for ischemia.  EF was 58%.   On talking to the patient he denies palpitations like the atrial  fibrillation, he is active, he runs about 3 miles five times a week, he  says though with hills, for example in the mountains, or if he has  several flights of stairs at work, he has 3 flights to climb, he finds  he is out of breath at the top, more out of proportion than he thinks he  should be for what he is trained.  He said he had never noticed this in  [place] but things were flatter in [place].   CURRENT MEDICATIONS:  1. Flecainide 50 b.i.d.  2. Toprol XL 25 daily.  3. Aspirin 81 mg daily.  4. Nexium 40 daily.   PHYSICAL EXAMINATION:  Patient is in no distress.  Blood pressure  132/80, pulse is 65 and regular, weight 188.  LUNGS:  Clear without rales or wheezes.  CARDIAC:  Regular rate and rhythm, S1-S2, no S3, no murmurs.  ABDOMEN:  Benign.  EXTREMITIES:  No edema.   IMPRESSION:  1. Atrial fibrillation, continue on Flecainide and Toprol as well as    aspirin.  2. Dyspnea.  I have discussed his case with Nicholes Mango.  I am not      sure if this is some deconditioning with hills and stairs.  I would      plan to set him up to do a cardiopulmonary stress test on his      medicines to see what happens.  Again, if it is at normal we will      need to work from there.  If normal, he can work on Neurosurgeon.   I have not set a definite followup but will await the results of the  cardiopulmonary stress test.     Pricilla Riffle, MD, [facility]  Electronically Signed    PVR/MedQ  DD: 07/28/2007  DT: 07/29/2007  Job #: [id number]   cc:   Bevelyn Buckles. Bensimhon, MD

## 2011-02-10 NOTE — Assessment & Plan Note (Signed)
[facility]                            CARDIOLOGY OFFICE NOTE   NAME:CONGELOSINaod, Sweetland                    MRN:          [id number]  DATE:07/16/2008                            DOB:          23-Apr-1961    IDENTIFICATION:  Mr. Becvar is a 50 year old gentleman with a history  of intermittent atrial fibrillation.  I last saw him back in October  2008.  He has been maintained on flecainide and Toprol since 2007.   The patient went to the [facility] Emergency Room on July 05, 2008 because  of an episode of chest pain.  He had had barbecue for dinner, and after  this, developed chest pressure that lasted 10-15 minutes and then came  back.  The course progressed over the evening across bilateral  precordiums.  He denied any difficulty swallowing liquids or solids.  It  was not pleuritic.  Radiating to the right upper back.  Aching pressure  and sharp.  He was seen in the [facility] Emergency Room and discharged,  recommended followup here.   Since then, he has not had any further symptoms.   He is still running 3-4 times per week (3 miles at a time, he states the  first eighth of a mile, he will be a little short of breath, then his  breathing markedly improves).  He does get short of breath with a flight  of stairs, though he has always had this, no change.   CURRENT MEDICINES INCLUDE:  1. Flecainide 50 b.i.d.  2. Toprol-XL 25.  3. Aspirin 81.  4. Nexium 40.   PHYSICAL EXAMINATION:  GENERAL:  The patient is in no distress.  VITAL SIGNS:  Blood pressure is 119/69, pulse 63 and regular, weight  179, down from 188 on last visit.  LUNGS:  Clear.  No wheezes.  CARDIAC:  Regular rate and rhythm.  S1 and S2.  No S3, no murmurs.  ABDOMEN:  Benign.  EXTREMITIES:  No edema.   A 12-lead EKG normal sinus rhythm, 60 beats per minute.   IMPRESSION:  1. Chest pain.  Pain is very atypical.  I am not sure what it      represents, question gastrointestinal.  Note,  since I have seen      him, he has had his gallbladder removed.  I do not think it is      cardiac.  He is running 3 miles 3 times a week without a problem.      His Myoview 1 year ago looks good, he met 12 minutes.  He has had a      cardiopulmonary stress test, but I have just read out again with      Nicholes Mango, it showed good exercise tolerance without any      evidence of cardiopulmonary limitations.  Note, he had had a      cardiac catheterization done more than several years ago that again      was reportedly negative, though I do not have the official report.      I wonder if it is related to the gastrointestinal issues and  I have      told the patient I would get back with him after talking to one of      our gastrointestinal doctors and may in deed refer him.  2. Atrial fibrillation.  I would continue him on flecainide, Toprol,      and aspirin.   I will set to see the patient back tentatively in one year's time, but I  have told him I would be in touch with him after talking to GI.     Pricilla Riffle, MD, [facility]  Electronically Signed    PVR/MedQ  DD: 07/16/2008  DT: 07/17/2008  Job #: [id number]

## 2011-02-10 NOTE — Assessment & Plan Note (Signed)
[facility]                            CARDIOLOGY OFFICE NOTE   MAYSON, MCNEISH                    MRN:          [id number]  DATE:05/02/2007                            DOB:          01-22-1961    IDENTIFYING INFORMATION/JUSTIFICATION FOR ADMISSION AND CARE:  Mr.  Terry Guzman is a 50 year old gentleman who is referred from urgent care  for continued care of atrial fibrillation.   HISTORY OF PRESENT ILLNESS:  The patient previously lived in [place].  Two years ago, after running in July 2006, his legs became weak and he  fell. He was taken to the emergency room and found to be in reported  atrial fibrillation and converted with an IV medicine.   A year ago in April 2007, again running, it happened again. Taken to the  hospital and given a medicine by vein and converted. He was seen by Dr.  Juluis Pitch and also Dr. Fredrik Cove at [facility]  (electrophysiologist.) He has undergone in 2006, a stress test and  echocardiogram. He had a cardiac catheterization done. He was placed on  Flecainide after April 2007 and Toprol.   The patient recently moved here. He had been doing well. He was trying  to get back into running. Over the past couple of weeks though, he has  felt with his running at the end, his heart rate is not coming down and  there has been some variability and up and down with his heart rate. He  said it can last up to 7 hours, where he does not feel quite right and  in fact, crappy at times. Also, when he goes up and down stairs, he  will get more winded.   The patient notes last week, he had some gas in his epigastric region,  then felt a little fullness high in his left chest, in the shoulder  region. This was not associated with any activity or fluttering. No  syncope. He does have acid reflux.   ALLERGIES:  NO KNOWN DRUG ALLERGIES.   MEDICATIONS:  Toprol XL 625 daily, Flecainide 50 b.i.d., aspirin 81 mg  daily, and  Nexium 40 daily.   PAST MEDICAL HISTORY:  1. Reported atrial fibrillation.  2. GE reflux.   FAMILY HISTORY:  Negative for atrial fibrillation. Paternal  grandfather's both sides had CAD and died in their 52's.   SOCIAL HISTORY:  The patient works for the [employer]. Recently relocated to  [place]. Does not smoke. Drinks 1 alcohol beverage per day. Tries to  run 3 miles 5 times per week.   REVIEW OF SYSTEMS:  All systems reviewed. Told he had a heart murmur in  the past. Again, echocardiogram was performed, await records. All  records are being requested. Otherwise negative to the above problem  except as noted.   PHYSICAL EXAMINATION:  GENERAL:  No acute distress.  VITAL SIGNS:  Blood pressure 113/71, pulse 55 and regular. Weight not  taken.  HEENT:  Normocephalic and atraumatic. EO MI. PERRLA. Throat clear. Nares  clear. Moist mucous membranes.  NECK:  JVP is normal. No thyromegaly or bruits.  LUNGS:  Clear to auscultation without rales.  CARDIOVASCULAR:  Regular rate and rhythm. S1 and S2 normal. No S3, no  significant murmurs.  ABDOMEN:  Supple, nontender. No hepatosplenomegaly, no masses. Normal  bowel sounds.  EXTREMITIES:  Good distal pulses throughout. No lower extremity edema.   LABORATORY DATA:  A 12 lead electrocardiogram shows normal sinus  bradycardia at 55 beats per minute. RV conduction delay.   IMPRESSION:  Terry Guzman is a 50 year old with reported atrial  fibrillation, currently in sinus rhythm. He has had more palpitations  that may indeed be recurrent. What I have recommended today is that we  check a B-met and TSH. I have also set him up for a cardiac beeper to  take with him. Do activities as tolerated. If he is not having spells,  increase his activity to running and transmit the rhythms.   In regard to his epigastric fullness, burping sensation, and left upper  chest pain, I am not convinced that is cardiac if it occurs without  activity and GI  recommended that he increase his Nexium to bid.   I will tentatively set followup for 4 weeks adn I will be watching for  the strips to come back and it may indeed be sooner.     Pricilla Riffle, MD, [facility]  Electronically Signed    PVR/MedQ  DD: 05/02/2007  DT: 05/02/2007  Job #: [id number]

## 2011-03-06 ENCOUNTER — Ambulatory Visit: Payer: Self-pay | Admitting: Internal Medicine

## 2011-05-28 ENCOUNTER — Other Ambulatory Visit: Payer: Self-pay | Admitting: Internal Medicine

## 2011-06-11 ENCOUNTER — Other Ambulatory Visit: Payer: Self-pay | Admitting: Internal Medicine

## 2011-06-25 LAB — DIFFERENTIAL
Basophils Absolute: 0
Basophils Relative: 0
Eosinophils Absolute: 0
Eosinophils Relative: 0
Lymphocytes Relative: 4 — ABNORMAL LOW
Lymphs Abs: 0.4 — ABNORMAL LOW
Monocytes Absolute: 0.3
Monocytes Relative: 3
Neutro Abs: 9.4 — ABNORMAL HIGH
Neutrophils Relative %: 93 — ABNORMAL HIGH

## 2011-06-25 LAB — BASIC METABOLIC PANEL
BUN: 10
CO2: 23
Calcium: 9.1
Creatinine, Ser: 1.08
GFR calc non Af Amer: >60
Glucose, Bld: 121 — ABNORMAL HIGH

## 2011-06-25 LAB — BASIC METABOLIC PANEL WITH GFR
Chloride: 104
GFR calc Af Amer: >60
Potassium: 3.8
Sodium: 136

## 2011-06-25 LAB — CBC
HCT: 44.3
Hemoglobin: 15.4
MCHC: 34.8
MCV: 86.9
Platelets: 219
RBC: 5.09
RDW: 12.7
WBC: 10.2

## 2011-06-25 LAB — POCT CARDIAC MARKERS
Myoglobin, poc: 158
Operator id: 277751

## 2011-06-25 LAB — D-DIMER, QUANTITATIVE: D-Dimer, Quant: <0.22

## 2011-06-29 LAB — BASIC METABOLIC PANEL
BUN: 9
CO2: 24
Chloride: 105
Glucose, Bld: 90
Potassium: 3.8

## 2011-06-29 LAB — CBC
HCT: 42.2
MCHC: 34.1
MCV: 88.4
Platelets: 232
WBC: 6.6

## 2011-06-29 LAB — D-DIMER, QUANTITATIVE: D-Dimer, Quant: 0.29

## 2011-06-29 LAB — POCT CARDIAC MARKERS
CKMB, poc: <1 — ABNORMAL LOW
Myoglobin, poc: 51.4

## 2011-06-29 LAB — DIFFERENTIAL
Basophils Relative: 0
Eosinophils Absolute: 0.1
Eosinophils Relative: 1
Lymphs Abs: 1.7

## 2011-06-29 LAB — B-NATRIURETIC PEPTIDE (CONVERTED LAB): Pro B Natriuretic peptide (BNP): <30

## 2011-07-13 LAB — CBC
MCV: 86
RBC: 5.37
WBC: 6.7

## 2011-07-13 LAB — DIFFERENTIAL
Lymphs Abs: 1.6
Monocytes Relative: 5
Neutro Abs: 4.8
Neutrophils Relative %: 71

## 2011-07-13 LAB — POCT CARDIAC MARKERS
CKMB, poc: <1 — ABNORMAL LOW
Troponin i, poc: <0.05

## 2011-07-13 LAB — BASIC METABOLIC PANEL
CO2: 30
Glucose, Bld: 103 — ABNORMAL HIGH
Potassium: 4.6
Sodium: 139

## 2011-07-13 LAB — D-DIMER, QUANTITATIVE: D-Dimer, Quant: <0.22

## 2011-08-30 ENCOUNTER — Other Ambulatory Visit: Payer: Self-pay | Admitting: Cardiovascular Disease

## 2011-09-28 ENCOUNTER — Other Ambulatory Visit: Payer: Self-pay | Admitting: Internal Medicine

## 2011-11-30 ENCOUNTER — Other Ambulatory Visit: Payer: Self-pay | Admitting: Cardiovascular Disease

## 2011-12-01 ENCOUNTER — Other Ambulatory Visit: Payer: Self-pay

## 2011-12-01 MED ORDER — PRAVASTATIN SODIUM 40 MG PO TABS: 40.0000 mg | ORAL_TABLET | Freq: Every day | ORAL | Status: DC

## 2012-01-11 ENCOUNTER — Telehealth: Payer: Self-pay | Admitting: Internal Medicine

## 2012-01-11 ENCOUNTER — Other Ambulatory Visit: Payer: Self-pay | Admitting: Internal Medicine

## 2012-01-11 NOTE — Telephone Encounter (Signed)
Patient needs appt

## 2012-01-11 NOTE — Telephone Encounter (Signed)
Pt made appt  01-22-12 @815am  needs refill of toprol at [facility]

## 2012-01-12 MED ORDER — METOPROLOL SUCCINATE ER 25 MG PO TB24: 25.0000 mg | ORAL_TABLET | Freq: Every day | ORAL | Status: DC

## 2012-01-20 ENCOUNTER — Other Ambulatory Visit: Payer: Self-pay | Admitting: Internal Medicine

## 2012-01-22 ENCOUNTER — Ambulatory Visit (INDEPENDENT_AMBULATORY_CARE_PROVIDER_SITE_OTHER): Payer: Federal, State, Local not specified - PPO | Admitting: Internal Medicine

## 2012-01-22 ENCOUNTER — Encounter: Payer: Self-pay | Admitting: Internal Medicine

## 2012-01-22 VITALS — BP 117/80 | HR 55 | Ht 68.0 in | Wt 204.0 lb

## 2012-01-22 DIAGNOSIS — I119 Hypertensive heart disease without heart failure: Secondary | ICD-10-CM

## 2012-01-22 DIAGNOSIS — E7800 Pure hypercholesterolemia, unspecified: Secondary | ICD-10-CM

## 2012-01-22 DIAGNOSIS — I4891 Unspecified atrial fibrillation: Secondary | ICD-10-CM

## 2012-01-22 LAB — LIPID PANEL
HDL: 44.4 mg/dL (ref >39.00)
Triglycerides: 152 mg/dL — ABNORMAL HIGH (ref 0.0–149.0)

## 2012-01-22 LAB — BASIC METABOLIC PANEL
Calcium: 9.3 mg/dL (ref 8.4–10.5)
GFR: 92.1 mL/min (ref >60.00)
Glucose, Bld: 93 mg/dL (ref 70–99)
Sodium: 140 mEq/L (ref 135–145)

## 2012-01-22 LAB — AST: AST: 23 U/L (ref 0–37)

## 2012-01-22 NOTE — Patient Instructions (Signed)
Lab work today We will call you with results.  Your physician wants you to follow-up in: 12 months You will receive a reminder letter in the mail two months in advance. If you don't receive a letter, please call our office to schedule the follow-up appointment.  

## 2012-01-22 NOTE — Progress Notes (Signed)
HPI  Patient is a 51 year old with a history of PAF and minimal CAD (cath in November 2011 with mild plaquing.  He has not been in clinic since that visit Last spell of afib was 2007. NO CP  No SOB Allergies  Allergen Reactions  . Clarithromycin   . Levofloxacin     Current Outpatient Prescriptions  Medication Sig Dispense Refill  . aspirin 81 MG tablet Take 81 mg by mouth daily.        Marland Kitchen esomeprazole (NEXIUM) 40 MG capsule Take 40 mg by mouth daily before breakfast.        . flecainide (TAMBOCOR) 50 MG tablet TAKE ONE TABLET BY MOUTH EVERY 12 HOURS  60 tablet  3  . metoprolol succinate (TOPROL XL) 25 MG 24 hr tablet Take 1 tablet (25 mg total) by mouth daily.  30 tablet  0  . Omega-3 Fatty Acids (FISH OIL) 1200 MG CAPS Take by mouth 2 (two) times daily.        . pravastatin (PRAVACHOL) 40 MG tablet Take 1 tablet (40 mg total) by mouth daily.  30 tablet  3    Past Medical History  Diagnosis Date  . Chest pain   . Atrial fibrillation   . Dyspnea   . HLD (hyperlipidemia)   . Glucose intolerance (impaired glucose tolerance)     Past Surgical History  Procedure Date  . Cholecystectomy   . Cardiac catheterization     Family History  Problem Relation Age of Onset  . Coronary artery disease      History   Social History  . Marital Status: Married    Spouse Name: N/A    Number of Children: N/A  . Years of Education: N/A   Occupational History  . [employer]    Social History Main Topics  . Smoking status: Never Smoker   . Smokeless tobacco: Not on file  . Alcohol Use: 3.5 oz/week    7 drink(s) per week  . Drug Use: No  . Sexually Active: Not on file   Other Topics Concern  . Not on file   Social History Narrative  . No narrative on file    Review of Systems:  All systems reviewed.  They are negative to the above problem except as previously stated.  Vital Signs: BP 117/80  Pulse 55  Ht 5\' 8"  (1.727 m)  Wt 204 lb (92.534 kg)  BMI 31.02 kg/m2  Physical  Exam Patient is in NAD HEENT:  Normocephalic, atraumatic. EOMI, PERRLA.  Neck: JVP is normal. No thyromegaly. No bruits.  Lungs: clear to auscultation. No rales no wheezes.  Heart: Regular rate and rhythm. Normal S1, S2. No S3.   No significant murmurs. PMI not displaced.  Abdomen:  Supple, nontender. Normal bowel sounds. No masses. No hepatomegaly.  Extremities:   Good distal pulses throughout. No lower extremity edema.  Musculoskeletal :moving all extremities.  Neuro:   alert and oriented x3.  CN II-XII grossly intact.  EKG:  SB  55 bpm. Assessment and Plan:   1.  CAD  Mild by cath in 2011.  Asymptomatic  Very active. Continue current regimen.    2.  PAF  No recurrence.  On Flecanide.  Check labs  3.,  Dyslipidemia.  Check lipids.

## 2012-01-25 ENCOUNTER — Other Ambulatory Visit: Payer: Self-pay | Admitting: *Deleted

## 2012-01-25 ENCOUNTER — Telehealth: Payer: Self-pay | Admitting: *Deleted

## 2012-01-25 DIAGNOSIS — E7800 Pure hypercholesterolemia, unspecified: Secondary | ICD-10-CM

## 2012-01-25 DIAGNOSIS — E782 Mixed hyperlipidemia: Secondary | ICD-10-CM

## 2012-01-25 MED ORDER — ATORVASTATIN CALCIUM 10 MG PO TABS: 10.0000 mg | ORAL_TABLET | Freq: Every day | ORAL | Status: DC

## 2012-01-25 NOTE — Telephone Encounter (Signed)
Called pt and gave lab results and scheduled appoint for f/u blood work and also called in 8weeks of atorvastatin 10mg  --take 1 tab p.o. q pm--# 45--no refill--pt did not want anymore until he knows he can tolerate it--nt

## 2012-01-25 NOTE — Telephone Encounter (Signed)
Error--nt 

## 2012-01-28 ENCOUNTER — Other Ambulatory Visit: Payer: Self-pay

## 2012-01-28 ENCOUNTER — Other Ambulatory Visit: Payer: Self-pay | Admitting: Internal Medicine

## 2012-01-28 MED ORDER — FLECAINIDE ACETATE 50 MG PO TABS: 50.0000 mg | ORAL_TABLET | Freq: Two times a day (BID) | ORAL | Status: DC

## 2012-02-08 ENCOUNTER — Other Ambulatory Visit: Payer: Self-pay | Admitting: Internal Medicine

## 2012-03-12 ENCOUNTER — Other Ambulatory Visit: Payer: Self-pay | Admitting: Internal Medicine

## 2012-03-14 ENCOUNTER — Telehealth: Payer: Self-pay | Admitting: Internal Medicine

## 2012-03-14 NOTE — Telephone Encounter (Signed)
Pt needs refill for 90 days of flenacide and toprol with refills for 1 year

## 2012-03-15 MED ORDER — ATORVASTATIN CALCIUM 10 MG PO TABS: 10.0000 mg | ORAL_TABLET | Freq: Every day | ORAL | Status: DC

## 2012-03-15 MED ORDER — FLECAINIDE ACETATE 50 MG PO TABS: 50.0000 mg | ORAL_TABLET | Freq: Two times a day (BID) | ORAL | Status: DC

## 2012-03-15 MED ORDER — METOPROLOL SUCCINATE ER 25 MG PO TB24: 25.0000 mg | ORAL_TABLET | Freq: Every day | ORAL | Status: DC

## 2012-03-15 NOTE — Telephone Encounter (Signed)
Pt is aware refills were sent

## 2012-03-15 NOTE — Telephone Encounter (Signed)
atorvastatin needs auth for refill, [facility], pt only has 1 tab left , needs asap, requests call when done [phone]

## 2012-03-17 ENCOUNTER — Other Ambulatory Visit (INDEPENDENT_AMBULATORY_CARE_PROVIDER_SITE_OTHER): Payer: Federal, State, Local not specified - PPO

## 2012-03-17 DIAGNOSIS — E7800 Pure hypercholesterolemia, unspecified: Secondary | ICD-10-CM

## 2012-03-17 LAB — LIPID PANEL
Cholesterol: 155 mg/dL (ref 0–200)
HDL: 40.2 mg/dL (ref >39.00)
LDL Cholesterol: 88 mg/dL (ref 0–99)
Total CHOL/HDL Ratio: 4
Triglycerides: 133 mg/dL (ref 0.0–149.0)

## 2012-03-17 LAB — HEPATIC FUNCTION PANEL
Alkaline Phosphatase: 81 U/L (ref 39–117)
Bilirubin, Direct: 0.1 mg/dL (ref 0.0–0.3)
Total Protein: 6.9 g/dL (ref 6.0–8.3)

## 2012-03-18 ENCOUNTER — Telehealth: Payer: Self-pay | Admitting: Internal Medicine

## 2012-03-18 NOTE — Telephone Encounter (Signed)
Patient returning nurse call, he can be reached at [phone].

## 2012-03-18 NOTE — Telephone Encounter (Signed)
Pt called with liver and lipid results

## 2012-10-10 ENCOUNTER — Encounter: Payer: Self-pay | Admitting: Nurse Practitioner

## 2012-10-10 ENCOUNTER — Ambulatory Visit (INDEPENDENT_AMBULATORY_CARE_PROVIDER_SITE_OTHER): Payer: Federal, State, Local not specified - PPO | Admitting: Nurse Practitioner

## 2012-10-10 VITALS — BP 130/84 | HR 64 | Ht 67.0 in | Wt 211.0 lb

## 2012-10-10 DIAGNOSIS — I10 Essential (primary) hypertension: Secondary | ICD-10-CM

## 2012-10-10 NOTE — Patient Instructions (Addendum)
I would recommend staying on your current medicines  Monitor your blood pressure at home. Omron is a great BP cuff to buy  You need to have follow up labs when you go back to your primary care doctor.  See Dr. Tenny Craw in April  Exercise regularly and work on your weight  Here are my tips to lose weight:  1. Drink only water. You do not need milk, juice, tea, soda or diet soda.  2. Do not eat anything "white". This includes white bread, potatoes, rice or mayo  3. Stay away from fried foods and sweets  4. Your portion should be the size of the palm of your hand.  5. Know what your weaknesses are and avoid.  6. Find an exercise you like and do it every day for 45 to 60 minutes.       Call the [facility] office at [phone] if you have any questions, problems or concerns.

## 2012-10-10 NOTE — Progress Notes (Signed)
Terry Guzman Date of Birth: 24-Jan-1961 Medical Record #[id number]  History of Present Illness: Terry Guzman is seen today for a work in visit. He is seen for Dr. Tenny Craw. He has a history of PAF and minimal CAD per cath back in November 2011 with mild plaquing. Last spell of atrial fib was in 2007. He was last seen here in April of 2013.  He comes in today. He is here alone. He is feeling pretty good. No chest pain. No arrhythmia. Has slacked off on his exercise program and has gained about 20 pounds per his report over the last 6 months. BP has been tracking up with readings in the 140's over mid 80's. Started on low dose HCTZ by his primary care this past Friday and was told to follow up here. He does not check his BP at home. Has used some of the machines at the drug store. Does not use salt.   Current Outpatient Prescriptions on File Prior to Visit  Medication Sig Dispense Refill  . aspirin 81 MG tablet Take 81 mg by mouth daily.        Marland Kitchen atorvastatin (LIPITOR) 10 MG tablet Take 1 tablet (10 mg total) by mouth daily.  90 tablet  3  . esomeprazole (NEXIUM) 40 MG capsule Take 40 mg by mouth daily before breakfast.        . flecainide (TAMBOCOR) 50 MG tablet Take 1 tablet (50 mg total) by mouth 2 (two) times daily.  180 tablet  3  . hydrochlorothiazide (MICROZIDE) 12.5 MG capsule Take 12.5 mg by mouth daily.       . metoprolol succinate (TOPROL-XL) 25 MG 24 hr tablet Take 1 tablet (25 mg total) by mouth daily.  90 tablet  3  . Omega-3 Fatty Acids (FISH OIL) 1200 MG CAPS Take by mouth 2 (two) times daily.          Allergies  Allergen Reactions  . Clarithromycin   . Levofloxacin     Past Medical History  Diagnosis Date  . Atrial fibrillation     on Flecainide  . Dyspnea   . HLD (hyperlipidemia)   . Glucose intolerance (impaired glucose tolerance)   . HTN (hypertension)   . CAD (coronary artery disease)     minimal per cath in November 2011 with mild plaquing.   . High risk  medication use     Flecainide therapy    Past Surgical History  Procedure Date  . Cholecystectomy   . Cardiac catheterization     History  Smoking status  . Never Smoker   Smokeless tobacco  . Not on file    History  Alcohol Use No    Family History  Problem Relation Age of Onset  . Coronary artery disease    . Heart disease Father   . Hypertension Father     Review of Systems: The review of systems is per the HPI.  All other systems were reviewed and are negative.  Physical Exam: BP 130/84  Pulse 64  Ht 5\' 7"  (1.702 m)  Wt 211 lb (95.709 kg)  BMI 33.05 kg/m2 Repeat BP by me today is 120/80. Patient is very pleasant and in no acute distress. Skin is warm and dry. Color is normal.  HEENT is unremarkable. Normocephalic/atraumatic. PERRL. Sclera are nonicteric. Neck is supple. No masses. No JVD. Lungs are clear. Cardiac exam shows a regular rate and rhythm. Abdomen is soft. Extremities are without edema. Gait and ROM are intact. No  gross neurologic deficits noted.  LABORATORY DATA:  Lab Results  Component Value Date   WBC 5.8 06/19/2010   HGB 15.3 06/19/2010   HCT 42.8 06/19/2010   PLT 216 06/19/2010   GLUCOSE 93 01/22/2012   CHOL 155 03/17/2012   TRIG 133.0 03/17/2012   HDL 40.20 03/17/2012   LDLCALC 88 03/17/2012   ALT 27 03/17/2012   AST 20 03/17/2012   AST 20 03/17/2012   NA 140 01/22/2012   K 4.2 01/22/2012   CL 105 01/22/2012   CREATININE 0.9 01/22/2012   BUN 14 01/22/2012   CO2 29 01/22/2012   TSH 1.18 05/02/2007   INR 0.96 06/19/2010   HGBA1C  Value: 6.2 (NOTE)                                                                       According to the ADA Clinical Practice Recommendations for 2011, when HbA1c is used as a screening test:   >=6.5%   Diagnostic of Diabetes Mellitus           (if abnormal result  is confirmed)  5.7-6.4%   Increased risk of developing Diabetes Mellitus  References:Diagnosis and Classification of Diabetes Mellitus,Diabetes Care,2011,34(Suppl  1):S62-S69 and Standards of Medical Care in         Diabetes - 2011,Diabetes Care,2011,34  (Suppl 1):S11-S61.* 06/18/2010    Assessment /Plan:  1. HTN - now on low dose HCTZ. BP looks good. I would leave him on his current regimen. Needs a BMET on his return visit with his PCP and the patient is aware. He has follow up with his PCP for his BP in one month. I have encouraged him to get his own BP cuff and monitor at home.   2. CAD - minimal per cath back in 2011. No chest pain reported.  3. HLD  4. Obesity - We have discussed means of weight loss and the need to increase his exercise program.   He is to see Dr. Tenny Craw back in April. \  Patient is agreeable to this plan and will call if any problems develop in the interim.

## 2013-01-06 ENCOUNTER — Encounter: Payer: Self-pay | Admitting: Internal Medicine

## 2013-01-06 ENCOUNTER — Ambulatory Visit (INDEPENDENT_AMBULATORY_CARE_PROVIDER_SITE_OTHER): Payer: Federal, State, Local not specified - PPO | Admitting: Internal Medicine

## 2013-01-06 VITALS — BP 119/72 | HR 63 | Ht 68.0 in | Wt 204.0 lb

## 2013-01-06 DIAGNOSIS — I4891 Unspecified atrial fibrillation: Secondary | ICD-10-CM

## 2013-01-06 NOTE — Progress Notes (Signed)
HPI Patient is a 52 year old with a history of PAF and minimal CAD (cath in November 2011 with mild plaquing and HTN  Last spell of afib was 2007.  Last seen in clinic by L Gerhardt. SInce seen he has done well.  He was started on HCTZ by primary MD for BP  BP is running better. He denies dizziness  No CP  No SOB No palpitations.   He is actively working at wt loss.   Allergies  Allergen Reactions  . Clarithromycin   . Levofloxacin     Current Outpatient Prescriptions  Medication Sig Dispense Refill  . aspirin 81 MG tablet Take 81 mg by mouth daily.        Marland Kitchen atorvastatin (LIPITOR) 10 MG tablet Take 1 tablet (10 mg total) by mouth daily.  90 tablet  3  . esomeprazole (NEXIUM) 40 MG capsule Take 40 mg by mouth daily before breakfast.        . flecainide (TAMBOCOR) 50 MG tablet Take 1 tablet (50 mg total) by mouth 2 (two) times daily.  180 tablet  3  . hydrochlorothiazide (MICROZIDE) 12.5 MG capsule Take 12.5 mg by mouth daily.       . metoprolol succinate (TOPROL-XL) 25 MG 24 hr tablet Take 1 tablet (25 mg total) by mouth daily.  90 tablet  3  . Omega-3 Fatty Acids (FISH OIL) 1200 MG CAPS Take by mouth 2 (two) times daily.         No current facility-administered medications for this visit.    Past Medical History  Diagnosis Date  . Atrial fibrillation     on Flecainide  . Dyspnea   . HLD (hyperlipidemia)   . Glucose intolerance (impaired glucose tolerance)   . HTN (hypertension)   . CAD (coronary artery disease)     minimal per cath in November 2011 with mild plaquing.   . High risk medication use     Flecainide therapy    Past Surgical History  Procedure Laterality Date  . Cholecystectomy    . Cardiac catheterization      Family History  Problem Relation Age of Onset  . Coronary artery disease    . Heart disease Father   . Hypertension Father     History   Social History  . Marital Status: Married    Spouse Name: N/A    Number of Children: N/A  . Years of  Education: N/A   Occupational History  . [employer]    Social History Main Topics  . Smoking status: Never Smoker   . Smokeless tobacco: Not on file  . Alcohol Use: No  . Drug Use: No  . Sexually Active: Yes   Other Topics Concern  . Not on file   Social History Narrative  . No narrative on file    Review of Systems:  All systems reviewed.  They are negative to the above problem except as previously stated.  Vital Signs: BP 119/72  Pulse 63  Ht 5\' 8"  (1.727 m)  Wt 204 lb (92.534 kg)  BMI 31.03 kg/m2  Physical Exam Patient is in NAD HEENT:  Normocephalic, atraumatic. EOMI, PERRLA.  Neck: JVP is normal.  No bruits.  Lungs: clear to auscultation. No rales no wheezes.  Heart: Regular rate and rhythm. Normal S1, S2. No S3.   No significant murmurs. PMI not displaced.  Abdomen:  Supple, nontender. Normal bowel sounds. No masses. No hepatomegaly.  Extremities:   Good distal pulses throughout. No  lower extremity edema.  Musculoskeletal :moving all extremities.  Neuro:   alert and oriented x3.  CN II-XII grossly intact.  EKG  SR 60 bpm.   Assessment and Plan:  1.  CAD  Mild plaquing at cath 3 years ago  2.  PAF.  No spell since 2007  Keep on current regimen  3.  HL  Keep on lipitor  Will get labs from primary MD  4.  HTN  Good control.

## 2013-01-06 NOTE — Patient Instructions (Signed)
Your physician wants you to follow-up in: December 2014. You will receive a reminder letter in the mail two months in advance. If you don't receive a letter, please call our office to schedule the follow-up appointment.

## 2013-02-25 ENCOUNTER — Other Ambulatory Visit: Payer: Self-pay | Admitting: Internal Medicine

## 2013-03-02 ENCOUNTER — Other Ambulatory Visit: Payer: Self-pay | Admitting: Internal Medicine

## 2013-03-15 ENCOUNTER — Other Ambulatory Visit: Payer: Self-pay | Admitting: Internal Medicine

## 2013-03-23 ENCOUNTER — Other Ambulatory Visit: Payer: Self-pay | Admitting: Internal Medicine

## 2014-03-15 ENCOUNTER — Other Ambulatory Visit: Payer: Self-pay | Admitting: *Deleted

## 2014-03-15 MED ORDER — FLECAINIDE ACETATE 50 MG PO TABS: 50.0000 mg | ORAL_TABLET | Freq: Two times a day (BID) | ORAL | Status: DC

## 2014-03-15 MED ORDER — METOPROLOL SUCCINATE ER 25 MG PO TB24: ORAL_TABLET | ORAL | Status: DC

## 2014-03-16 ENCOUNTER — Other Ambulatory Visit: Payer: Self-pay | Admitting: Internal Medicine

## 2014-05-07 ENCOUNTER — Ambulatory Visit (INDEPENDENT_AMBULATORY_CARE_PROVIDER_SITE_OTHER): Payer: Federal, State, Local not specified - PPO | Admitting: Internal Medicine

## 2014-05-07 VITALS — BP 125/72 | HR 57 | Ht 68.0 in | Wt 214.0 lb

## 2014-05-07 DIAGNOSIS — I2510 Atherosclerotic heart disease of native coronary artery without angina pectoris: Secondary | ICD-10-CM

## 2014-05-07 NOTE — Progress Notes (Signed)
HPI Patient is a 53 year old with a history of PAF and minimal CAD (cath in November 2011 with mild plaquing and HTN  Last spell of afib was 2007.  I saw him in clinic In April 2014. Since seen has done well  No CP  No SOB  Broke ankle earlier this year.  Recovered  Will start working out again  Allergies  Allergen Reactions  . Clarithromycin   . Levofloxacin     Current Outpatient Prescriptions  Medication Sig Dispense Refill  . aspirin 81 MG tablet Take 81 mg by mouth daily.        Marland Kitchen atorvastatin (LIPITOR) 10 MG tablet TAKE 1 TABLET BY MOUTH EVERY DAY  90 tablet  0  . esomeprazole (NEXIUM) 40 MG capsule Take 40 mg by mouth daily before breakfast.        . flecainide (TAMBOCOR) 50 MG tablet Take 1 tablet (50 mg total) by mouth 2 (two) times daily.  60 tablet  1  . hydrochlorothiazide (MICROZIDE) 12.5 MG capsule Take 12.5 mg by mouth daily.       . metoprolol succinate (TOPROL XL) 25 MG 24 hr tablet TAKE 1 TABLET (25 MG TOTAL) BY MOUTH DAILY.  30 tablet  1  . Omega-3 Fatty Acids (FISH OIL) 1200 MG CAPS Take by mouth 2 (two) times daily.         No current facility-administered medications for this visit.    Past Medical History  Diagnosis Date  . Atrial fibrillation     on Flecainide  . Dyspnea   . HLD (hyperlipidemia)   . Glucose intolerance (impaired glucose tolerance)   . HTN (hypertension)   . CAD (coronary artery disease)     minimal per cath in November 2011 with mild plaquing.   . High risk medication use     Flecainide therapy    Past Surgical History  Procedure Laterality Date  . Cholecystectomy    . Cardiac catheterization      Family History  Problem Relation Age of Onset  . Coronary artery disease    . Heart disease Father   . Hypertension Father     History   Social History  . Marital Status: Married    Spouse Name: N/A    Number of Children: N/A  . Years of Education: N/A   Occupational History  . [employer]    Social History Main Topics  .  Smoking status: Never Smoker   . Smokeless tobacco: Not on file  . Alcohol Use: No  . Drug Use: No  . Sexual Activity: Yes   Other Topics Concern  . Not on file   Social History Narrative  . No narrative on file    Review of Systems:  All systems reviewed.  They are negative to the above problem except as previously stated.  Vital Signs: BP 125/72  Pulse 57  Ht 5\' 8"  (1.727 m)  Wt 214 lb (97.07 kg)  BMI 32.55 kg/m2  Physical Exam Patient is in NAD HEENT:  Normocephalic, atraumatic. EOMI, PERRLA.  Neck: JVP is normal.  No bruits.  Lungs: clear to auscultation. No rales no wheezes.  Heart: Regular rate and rhythm. Normal S1, S2. No S3.   No significant murmurs. PMI not displaced.  Abdomen:  Supple, nontender. Normal bowel sounds. No masses. No hepatomegaly.  Extremities:   Good distal pulses throughout. No lower extremity edema.  Musculoskeletal :moving all extremities.  Neuro:   alert and oriented x3.  CN  II-XII grossly intact.  EKG  Sb 57 bpm.   Assessment and Plan:  1.  CAD  Mild plaquing at cath 3 years ago  Asymptomatic  2.  PAF.  No spell since 2007  Keep on current regimen  3.  HL  Keep on lipitor  Will get labs from primary MD  4.  HTN  Good control.  F/U in 18 months

## 2014-05-18 ENCOUNTER — Other Ambulatory Visit: Payer: Self-pay | Admitting: Internal Medicine

## 2014-05-23 ENCOUNTER — Other Ambulatory Visit: Payer: Self-pay | Admitting: Internal Medicine

## 2014-06-16 ENCOUNTER — Other Ambulatory Visit: Payer: Self-pay | Admitting: Internal Medicine

## 2014-09-20 ENCOUNTER — Other Ambulatory Visit: Payer: Self-pay | Admitting: Internal Medicine

## 2014-11-12 ENCOUNTER — Other Ambulatory Visit: Payer: Self-pay | Admitting: Internal Medicine

## 2014-12-17 ENCOUNTER — Other Ambulatory Visit: Payer: Self-pay | Admitting: *Deleted

## 2014-12-17 MED ORDER — ATORVASTATIN CALCIUM 10 MG PO TABS: 10.0000 mg | ORAL_TABLET | Freq: Every day | ORAL | Status: DC

## 2015-05-19 ENCOUNTER — Other Ambulatory Visit: Payer: Self-pay | Admitting: Internal Medicine

## 2015-06-21 ENCOUNTER — Encounter: Payer: Self-pay | Admitting: Internal Medicine

## 2015-06-24 ENCOUNTER — Other Ambulatory Visit: Payer: Self-pay | Admitting: Internal Medicine

## 2015-06-24 NOTE — Telephone Encounter (Signed)
Terry Riffle, MD at 05/07/2014 9:52 AM  flecainide (TAMBOCOR) 50 MG tabletTake 1 tablet (50 mg total) by mouth 2 (two) times daily 2. PAF. No spell since 2007 Keep on current regimen

## 2015-07-01 ENCOUNTER — Ambulatory Visit (INDEPENDENT_AMBULATORY_CARE_PROVIDER_SITE_OTHER): Payer: Federal, State, Local not specified - PPO | Admitting: Internal Medicine

## 2015-07-01 ENCOUNTER — Encounter: Payer: Self-pay | Admitting: Internal Medicine

## 2015-07-01 VITALS — BP 116/70 | HR 63 | Ht 68.0 in | Wt 221.4 lb

## 2015-07-01 DIAGNOSIS — I4891 Unspecified atrial fibrillation: Secondary | ICD-10-CM

## 2015-07-01 MED ORDER — METOPROLOL SUCCINATE ER 25 MG PO TB24: 25.0000 mg | ORAL_TABLET | Freq: Every day | ORAL | Status: DC

## 2015-07-01 MED ORDER — FLECAINIDE ACETATE 50 MG PO TABS: 50.0000 mg | ORAL_TABLET | Freq: Two times a day (BID) | ORAL | Status: DC

## 2015-07-01 MED ORDER — ATORVASTATIN CALCIUM 10 MG PO TABS: 10.0000 mg | ORAL_TABLET | Freq: Every day | ORAL | Status: DC

## 2015-07-01 NOTE — Patient Instructions (Signed)
Your physician recommends that you continue on your current medications as directed. Please refer to the Current Medication list given to you today. Your physician wants you to follow-up in: 1 year with Dr. Ross.  You will receive a reminder letter in the mail two months in advance. If you don't receive a letter, please call our office to schedule the follow-up appointment.  

## 2015-07-01 NOTE — Progress Notes (Addendum)
Cardiology Office Note   Date:  07/01/2015   ID:  Terry Guzman, DOB 1960-10-08, MRN [id number]  PCP:  Katharina Caper, NP  Cardiologist:   Dietrich Pates, MD   No chief complaint on file.  Follow up of afib and HTN   History of Present Illness: Terry Guzman is a 54 y.o. male with a history of PAF (last spell in 2007)  and minimal CAD (by cath in November 2011) and HTN  Since seen in clinic he has done well  No CP  No SOB  No palpitations.  He is active      Current Outpatient Prescriptions  Medication Sig Dispense Refill  . aspirin 81 MG tablet Take 81 mg by mouth daily.      Marland Kitchen atorvastatin (LIPITOR) 10 MG tablet Take 1 tablet (10 mg total) by mouth daily. 90 tablet 2  . esomeprazole (NEXIUM) 40 MG capsule Take 40 mg by mouth daily before breakfast.      . flecainide (TAMBOCOR) 50 MG tablet TAKE 1 TABLET (50 MG TOTAL) BY MOUTH 2 (TWO) TIMES DAILY. 60 tablet 1  . loratadine (CLARITIN) 10 MG tablet Take 10 mg by mouth daily.  11  . Omega-3 Fatty Acids (FISH OIL) 1200 MG CAPS Take by mouth 2 (two) times daily.      . TOPROL XL 25 MG 24 hr tablet TAKE 1 TABLET (25 MG TOTAL) BY MOUTH DAILY. 30 tablet 10   No current facility-administered medications for this visit.    Allergies:   Clarithromycin; Levofloxacin; Other; and Statins   Past Medical History  Diagnosis Date  . Atrial fibrillation (HCC)     on Flecainide  . Dyspnea   . HLD (hyperlipidemia)   . Glucose intolerance (impaired glucose tolerance)   . HTN (hypertension)   . CAD (coronary artery disease)     minimal per cath in November 2011 with mild plaquing.   . High risk medication use     Flecainide therapy    Past Surgical History  Procedure Laterality Date  . Cholecystectomy    . Cardiac catheterization       Social History:  The patient  reports that he has never smoked. He does not have any smokeless tobacco history on file. He reports that he does not drink alcohol or use illicit drugs.    Family History:  The patient's family history includes Coronary artery disease in an other family member; Heart disease in his father; Hypertension in his father.    ROS:  Please see the history of present illness. All other systems are reviewed and  Negative to the above problem except as noted.    PHYSICAL EXAM: VS:  BP 116/70 mmHg  Pulse 63  Ht  (1.727 m)  Wt 221 lb 6.4 oz (100.426 kg)  BMI 33.67 kg/m2  SpO2 97%  GEN: Well nourished, well developed, in no acute distress HEENT: normal Neck: no JVD, carotid bruits, or masses Cardiac: RRR; no murmurs, rubs, or gallops,no edema  Respiratory:  clear to auscultation bilaterally, normal work of breathing GI: soft, nontender, nondistended, + BS  No hepatomegaly  MS: no deformity Moving all extremities   Skin: warm and dry, no rash Neuro:  Strength and sensation are intact Psych: euthymic mood, full affect   EKG:  EKG is ordered today.  SR 60 bpm     Lipid Panel    Component Value Date/Time   CHOL 155 03/17/2012 0854   TRIG 133.0 03/17/2012 0854  HDL 40.20 03/17/2012 0854   CHOLHDL 4 03/17/2012 0854   VLDL 26.6 03/17/2012 0854   LDLCALC 88 03/17/2012 0854      Wt Readings from Last 3 Encounters:  07/01/15 221 lb 6.4 oz (100.426 kg)  05/07/14 214 lb (97.07 kg)  01/06/13 204 lb (92.534 kg)      ASSESSMENT AND PLAN:  1.  PAF  No apparent recurrence  Keep on Flecanide   2.  Minimal CAD  Pt denies symptoms  3.  HTN  Good control  4.  HL  REcent lipid panel drawn after pt just got back from [place]  LDL was 104, HDL37  Continue meds   Stay active    Disposition:   FU with me in 1 year    Signed, Dietrich Pates, MD  07/01/2015 3:38 PM    [facility] [address] [place], [place], [place]  [address] Phone: [phone]; Fax: [phone]

## 2015-09-08 ENCOUNTER — Other Ambulatory Visit: Payer: Self-pay | Admitting: Internal Medicine

## 2016-07-14 ENCOUNTER — Other Ambulatory Visit: Payer: Self-pay | Admitting: Internal Medicine

## 2016-08-16 ENCOUNTER — Other Ambulatory Visit: Payer: Self-pay | Admitting: Internal Medicine

## 2016-08-17 MED ORDER — METOPROLOL SUCCINATE ER 25 MG PO TB24: 25.0000 mg | ORAL_TABLET | Freq: Every day | ORAL | 0 refills | Status: DC

## 2016-08-17 NOTE — Addendum Note (Signed)
Addended by: Demetrios LollBARNARD, CATHY C on: 08/17/2016 03:47 PM   Modules accepted: Orders

## 2016-08-27 ENCOUNTER — Other Ambulatory Visit: Payer: Self-pay

## 2016-09-12 ENCOUNTER — Other Ambulatory Visit: Payer: Self-pay | Admitting: Internal Medicine

## 2016-09-14 ENCOUNTER — Other Ambulatory Visit: Payer: Self-pay | Admitting: Internal Medicine

## 2016-10-08 ENCOUNTER — Encounter: Payer: Self-pay | Admitting: Internal Medicine

## 2016-10-08 ENCOUNTER — Ambulatory Visit (INDEPENDENT_AMBULATORY_CARE_PROVIDER_SITE_OTHER): Payer: Federal, State, Local not specified - PPO | Admitting: Internal Medicine

## 2016-10-08 VITALS — BP 148/92 | HR 69 | Ht 68.0 in | Wt 234.8 lb

## 2016-10-08 DIAGNOSIS — E785 Hyperlipidemia, unspecified: Secondary | ICD-10-CM

## 2016-10-08 DIAGNOSIS — I4891 Unspecified atrial fibrillation: Secondary | ICD-10-CM | POA: Diagnosis not present

## 2016-10-08 DIAGNOSIS — I2510 Atherosclerotic heart disease of native coronary artery without angina pectoris: Secondary | ICD-10-CM | POA: Diagnosis not present

## 2016-10-08 NOTE — Progress Notes (Signed)
Cardiology Office Note   Date:  10/08/2016   ID:  Terry Guzman, DOB [birth year], MRN [id number]  PCP:  Katharina CaperKNOTT, BEVERLY, NP  Cardiologist:   Dietrich PatesPaula Briani Maul, MD   F/U of PAF      History of Present Illness: Terry Guzman is a 56 y.o. male with a history of PAF (last documented spell in 2007), minimal CAD (by cath in 2011) and HTN  I last saw him in clinic in October 2016     Since seen he has done well from a cardiac standpoint   No CP  Breathing is OK  No palpitations  He says his weight is up because of work, dinners out  Very stressful busy year  This is easing and he is now on diet        Current Meds  Medication Sig  . aspirin 81 MG tablet Take 81 mg by mouth daily.    Marland Kitchen. atorvastatin (LIPITOR) 10 MG tablet TAKE 1 TABLET (10 MG TOTAL) BY MOUTH DAILY.  Marland Kitchen. esomeprazole (NEXIUM) 40 MG capsule Take 40 mg by mouth daily before breakfast.    . flecainide (TAMBOCOR) 50 MG tablet Take 1 tablet (50 mg total) by mouth 2 (two) times daily. Please keep 10/08/16 appt for further refills  . loratadine (CLARITIN) 10 MG tablet Take 10 mg by mouth daily.  . metoprolol succinate (TOPROL-XL) 25 MG 24 hr tablet Take 1 tablet (25 mg total) by mouth daily.  . Omega-3 Fatty Acids (FISH OIL) 1200 MG CAPS Take by mouth 2 (two) times daily.       Allergies:   Clarithromycin; Ezetimibe; Levofloxacin; Other; and Statins   Past Medical History:  Diagnosis Date  . Atrial fibrillation (HCC)    on Flecainide  . CAD (coronary artery disease)    minimal per cath in November 2011 with mild plaquing.   Marland Kitchen. Dyspnea   . Glucose intolerance (impaired glucose tolerance)   . High risk medication use    Flecainide therapy  . HLD (hyperlipidemia)   . HTN (hypertension)     Past Surgical History:  Procedure Laterality Date  . CARDIAC CATHETERIZATION    . CHOLECYSTECTOMY       Social History:  The patient  reports that he has never smoked. He has never used smokeless tobacco. He reports that he  does not drink alcohol or use drugs.   Family History:  The patient's family history includes Heart disease in his father; Hypertension in his father.    ROS:  Please see the history of present illness. All other systems are reviewed and  Negative to the above problem except as noted.    PHYSICAL EXAM: VS:  BP (!) 148/92   Pulse 69   Ht 5\' 8"  (1.727 m)   Wt 234 lb 12.8 oz (106.5 kg)   BMI 35.70 kg/m   GEN: Well nourished, well developed, in no acute distress  HEENT: normal  Neck: no JVD, carotid bruits, or masses Cardiac: RRR; no murmurs, rubs, or gallops,no edema  Respiratory:  clear to auscultation bilaterally, normal work of breathing GI: soft, nontender, nondistended, + BS  No hepatomegaly  MS: no deformity Moving all extremities   Skin: warm and dry, no rash Neuro:  Strength and sensation are intact Psych: euthymic mood, full affect   EKG:  EKG is ordered today.  SR 69   Lipid Panel    Component Value Date/Time   CHOL 155 03/17/2012 0854   TRIG 133.0 03/17/2012 0854  HDL 40.20 03/17/2012 0854   CHOLHDL 4 03/17/2012 0854   VLDL 26.6 03/17/2012 0854   LDLCALC 88 03/17/2012 0854      Wt Readings from Last 3 Encounters:  10/08/16 234 lb 12.8 oz (106.5 kg)  07/01/15 221 lb 6.4 oz (100.4 kg)  05/07/14 214 lb (97.1 kg)      ASSESSMENT AND PLAN:  1  CAD  Minimal by cath  No symptoms  Continue ASA and statin  2  PAF  Denies recurrence since 2000s when had 2 spells  PLaced on flecanide after second spell  None since  3  HL  Will check lipids   Continue statin  4  Obesity  He is working on wt loss   F/U in 1 year    Current medicines are reviewed at length with the patient today.  The patient does not have concerns regarding medicines.  Signed, Dietrich Pates, MD  10/08/2016 4:14 PM    [facility] [address] [place], [place], [place]  [address] Phone: [phone]; Fax: [phone]

## 2016-10-08 NOTE — Patient Instructions (Signed)
Your physician recommends that you continue on your current medications as directed. Please refer to the Current Medication list given to you today.  Your physician recommends that you return for lab work in: TODAY (bmet, cbc, lipids)  Your physician wants you to follow-up in: 1 year with Dr. Tenny Crawoss.  You will receive a reminder letter in the mail two months in advance. If you don't receive a letter, please call our office to schedule the follow-up appointment.

## 2016-10-09 LAB — BASIC METABOLIC PANEL
BUN / CREAT RATIO: 11 (ref 9–20)
BUN: 11 mg/dL (ref 6–24)
CO2: 25 mmol/L (ref 18–29)
Calcium: 9.5 mg/dL (ref 8.7–10.2)
Chloride: 98 mmol/L (ref 96–106)
Creatinine, Ser: 0.97 mg/dL (ref 0.76–1.27)
GFR calc Af Amer: 101 mL/min/{1.73_m2} (ref >59)
GFR calc non Af Amer: 88 mL/min/{1.73_m2} (ref >59)
Glucose: 88 mg/dL (ref 65–99)
POTASSIUM: 4.4 mmol/L (ref 3.5–5.2)
SODIUM: 140 mmol/L (ref 134–144)

## 2016-10-09 LAB — LIPID PANEL
CHOL/HDL RATIO: 4.4 ratio (ref 0.0–5.0)
CHOLESTEROL TOTAL: 171 mg/dL (ref 100–199)
HDL: 39 mg/dL — ABNORMAL LOW (ref >39)
LDL Calculated: 105 mg/dL — ABNORMAL HIGH (ref 0–99)
TRIGLYCERIDES: 137 mg/dL (ref 0–149)
VLDL Cholesterol Cal: 27 mg/dL (ref 5–40)

## 2016-10-09 LAB — CBC
Hematocrit: 45.5 % (ref 37.5–51.0)
Hemoglobin: 15.3 g/dL (ref 13.0–17.7)
MCH: 28.2 pg (ref 26.6–33.0)
MCHC: 33.6 g/dL (ref 31.5–35.7)
MCV: 84 fL (ref 79–97)
Platelets: 278 10*3/uL (ref 150–379)
RBC: 5.43 x10E6/uL (ref 4.14–5.80)
RDW: 14.6 % (ref 12.3–15.4)
WBC: 7.8 10*3/uL (ref 3.4–10.8)

## 2016-10-30 ENCOUNTER — Telehealth: Payer: Self-pay | Admitting: Internal Medicine

## 2016-10-30 NOTE — Telephone Encounter (Signed)
Patient made aware of results. Patient verbalizes understanding.  

## 2016-10-30 NOTE — Telephone Encounter (Signed)
New Message ° °Pt voiced returning nurses call. ° °Please f/u °

## 2016-10-30 NOTE — Telephone Encounter (Signed)
-----   Message from Dietrich PatesPaula Ross V, MD sent at 10/12/2016 11:12 AM EST ----- CBC is normal Electrolytes and kidney function are normal LDL is higher than it has been  Keep on current lipitor dose  Pt said he is working on diet  Would do this  Goal to get LDL 80s or lower

## 2016-12-14 ENCOUNTER — Other Ambulatory Visit: Payer: Self-pay | Admitting: Internal Medicine

## 2016-12-24 ENCOUNTER — Other Ambulatory Visit: Payer: Self-pay | Admitting: Internal Medicine

## 2017-01-05 ENCOUNTER — Other Ambulatory Visit: Payer: Self-pay | Admitting: Internal Medicine

## 2017-09-04 ENCOUNTER — Other Ambulatory Visit: Payer: Self-pay | Admitting: Internal Medicine

## 2017-09-16 ENCOUNTER — Other Ambulatory Visit: Payer: Self-pay | Admitting: Internal Medicine

## 2017-10-11 ENCOUNTER — Other Ambulatory Visit: Payer: Self-pay | Admitting: Internal Medicine

## 2017-10-18 ENCOUNTER — Encounter: Payer: Self-pay | Admitting: *Deleted

## 2017-11-01 ENCOUNTER — Ambulatory Visit: Payer: Federal, State, Local not specified - PPO | Admitting: Internal Medicine

## 2017-11-15 ENCOUNTER — Other Ambulatory Visit: Payer: Self-pay

## 2017-11-15 ENCOUNTER — Other Ambulatory Visit: Payer: Self-pay | Admitting: Internal Medicine

## 2017-11-15 MED ORDER — FLECAINIDE ACETATE 50 MG PO TABS: 50.0000 mg | ORAL_TABLET | Freq: Two times a day (BID) | ORAL | 0 refills | Status: DC

## 2017-11-16 NOTE — Telephone Encounter (Signed)
Medication Detail    Disp Refills Start End   flecainide (TAMBOCOR) 50 MG tablet 180 tablet 0 11/15/2017    Sig - Route: Take 1 tablet (50 mg total) by mouth 2 (two) times daily. - Oral   Sent to pharmacy as: flecainide (TAMBOCOR) 50 MG tablet   E-Prescribing Status: Receipt confirmed by pharmacy (11/15/2017 8:16 AM EST)   Pharmacy   [id number] - [place], [place] - [address]

## 2017-11-25 ENCOUNTER — Ambulatory Visit: Payer: Federal, State, Local not specified - PPO | Admitting: Internal Medicine

## 2017-12-19 ENCOUNTER — Other Ambulatory Visit: Payer: Self-pay | Admitting: Internal Medicine

## 2017-12-30 ENCOUNTER — Ambulatory Visit: Payer: Federal, State, Local not specified - PPO | Admitting: Internal Medicine

## 2017-12-30 VITALS — BP 134/86 | HR 77 | Ht 68.0 in | Wt 226.0 lb

## 2017-12-30 DIAGNOSIS — I480 Paroxysmal atrial fibrillation: Secondary | ICD-10-CM

## 2017-12-30 DIAGNOSIS — E785 Hyperlipidemia, unspecified: Secondary | ICD-10-CM | POA: Diagnosis not present

## 2017-12-30 DIAGNOSIS — I2510 Atherosclerotic heart disease of native coronary artery without angina pectoris: Secondary | ICD-10-CM | POA: Diagnosis not present

## 2017-12-30 DIAGNOSIS — E782 Mixed hyperlipidemia: Secondary | ICD-10-CM | POA: Diagnosis not present

## 2017-12-30 NOTE — Patient Instructions (Signed)
Your physician recommends that you continue on your current medications as directed. Please refer to the Current Medication list given to you today. Your physician recommends that you return for lab work in: today (CBC, BMET, LIPIDS) Your physician wants you to follow-up in: 1 YEAR WITH DR. Tenny CrawOSS.  You will receive a reminder letter in the mail two months in advance. If you don't receive a letter, please call our office to schedule the follow-up appointment.

## 2017-12-30 NOTE — Progress Notes (Signed)
Cardiology Office Note   Date:  12/30/2017   ID:  Terry Guzman, DOB 1961-04-11, MRN [id number]  PCP:  Katharina CaperKnott, [place], NP  Cardiologist:   Dietrich PatesPaula Kalei Meda, MD   F/U of PAF     History of Present Illness: Terry Guzman is a 57 y.o. male with a history of PAF (last documented spell in 2007), minimal CAD (by cath in 2011) and HTN  I last saw him in clinic 1 yr ago  Since seen he has done well from a cardiac standpoint    Active   Less stress   Deneis palpitaitons   Breathing is OK Had 2 bouts of shingles recently   Gained wt during that time     Current Meds  Medication Sig  . aspirin 81 MG tablet Take 81 mg by mouth daily.    Marland Kitchen. atorvastatin (LIPITOR) 10 MG tablet TAKE 1 TABLET (10 MG TOTAL) BY MOUTH DAILY.  Marland Kitchen. esomeprazole (NEXIUM) 40 MG capsule Take 40 mg by mouth daily before breakfast.    . flecainide (TAMBOCOR) 50 MG tablet Take 1 tablet (50 mg total) by mouth 2 (two) times daily.  . metoprolol succinate (TOPROL-XL) 25 MG 24 hr tablet Take 1 tablet (25 mg total) by mouth daily. Please keep upcoming appt before anymore refills. Thank you  . Omega-3 Fatty Acids (FISH OIL) 1200 MG CAPS Take by mouth 2 (two) times daily.    . [DISCONTINUED] loratadine (CLARITIN) 10 MG tablet Take 10 mg by mouth daily.     Allergies:   Clarithromycin; Ezetimibe; Levofloxacin; Other; and Statins   Past Medical History:  Diagnosis Date  . Atrial fibrillation (HCC)    on Flecainide  . CAD (coronary artery disease)    minimal per cath in November 2011 with mild plaquing.   Marland Kitchen. Dyspnea   . Glucose intolerance (impaired glucose tolerance)   . High risk medication use    Flecainide therapy  . HLD (hyperlipidemia)   . HTN (hypertension)     Past Surgical History:  Procedure Laterality Date  . CARDIAC CATHETERIZATION    . CHOLECYSTECTOMY       Social History:  The patient  reports that he has never smoked. He has never used smokeless tobacco. He reports that he does not drink  alcohol or use drugs.   Family History:  The patient's family history includes Coronary artery disease in his unknown relative; Heart disease in his father; Hypertension in his father.    ROS:  Please see the history of present illness. All other systems are reviewed and  Negative to the above problem except as noted.    PHYSICAL EXAM: VS:  BP 134/86   Pulse 77   Ht 5\' 8"  (1.727 m)   Wt 226 lb (102.5 kg)   BMI 34.36 kg/m   GEN: Obese 57 yo in no acute distress  HEENT: normal  Neck: JVP normal  carotid bruits, or masses Cardiac: RRR; no murmurs, rubs, or gallops,no edema  Respiratory:  clear to auscultation bilaterally, normal work of breathing GI: soft, nontender, nondistended, + BS  No hepatomegaly  MS: no deformity Moving all extremities   Skin: warm and dry, no rash Neuro:  Strength and sensation are intact Psych: euthymic mood, full affect   EKG:  EKG is ordered today.  SR 69   Lipid Panel    Component Value Date/Time   CHOL 171 10/08/2016 1635   TRIG 137 10/08/2016 1635   HDL 39 (L) 10/08/2016 1635  CHOLHDL 4.4 10/08/2016 1635   CHOLHDL 4 03/17/2012 0854   VLDL 26.6 03/17/2012 0854   LDLCALC 105 (H) 10/08/2016 1635      Wt Readings from Last 3 Encounters:  12/30/17 226 lb (102.5 kg)  10/08/16 234 lb 12.8 oz (106.5 kg)  07/01/15 221 lb 6.4 oz (100.4 kg)      ASSESSMENT AND PLAN:  1  CAD  Minimal by cath  No symptoms  Continue ASA and statin  2  PAF  Asymptomatic   Keep on same meds   Check labs   3  HL  Will check lipids   Continue statin  4  Obesity Discussed exercise, diet  F/U in 1 year    Current medicines are reviewed at length with the patient today.  The patient does not have concerns regarding medicines.  Signed, Dietrich Pates, MD  12/30/2017 4:50 PM    [facility] [address] [place], [place], [place]  [address] Phone: [phone]; Fax: [phone]

## 2017-12-31 LAB — BASIC METABOLIC PANEL
BUN / CREAT RATIO: 12 (ref 9–20)
BUN: 11 mg/dL (ref 6–24)
CALCIUM: 9.3 mg/dL (ref 8.7–10.2)
CHLORIDE: 100 mmol/L (ref 96–106)
CO2: 22 mmol/L (ref 20–29)
CREATININE: 0.91 mg/dL (ref 0.76–1.27)
GFR calc non Af Amer: 93 mL/min/{1.73_m2} (ref >59)
GFR, EST AFRICAN AMERICAN: 108 mL/min/{1.73_m2} (ref >59)
GLUCOSE: 77 mg/dL (ref 65–99)
Potassium: 4.2 mmol/L (ref 3.5–5.2)
Sodium: 140 mmol/L (ref 134–144)

## 2017-12-31 LAB — CBC
HEMOGLOBIN: 15.6 g/dL (ref 13.0–17.7)
Hematocrit: 44.9 % (ref 37.5–51.0)
MCH: 29.2 pg (ref 26.6–33.0)
MCHC: 34.7 g/dL (ref 31.5–35.7)
MCV: 84 fL (ref 79–97)
PLATELETS: 270 10*3/uL (ref 150–379)
RBC: 5.34 x10E6/uL (ref 4.14–5.80)
RDW: 14.6 % (ref 12.3–15.4)
WBC: 7.8 10*3/uL (ref 3.4–10.8)

## 2017-12-31 LAB — LIPID PANEL
CHOLESTEROL TOTAL: 180 mg/dL (ref 100–199)
Chol/HDL Ratio: 3.8 ratio (ref 0.0–5.0)
HDL: 47 mg/dL (ref >39)
LDL CALC: 104 mg/dL — AB (ref 0–99)
Triglycerides: 147 mg/dL (ref 0–149)
VLDL CHOLESTEROL CAL: 29 mg/dL (ref 5–40)

## 2018-01-12 ENCOUNTER — Encounter: Payer: Self-pay | Admitting: *Deleted

## 2018-01-24 ENCOUNTER — Other Ambulatory Visit: Payer: Self-pay | Admitting: Internal Medicine

## 2018-02-22 ENCOUNTER — Other Ambulatory Visit: Payer: Self-pay | Admitting: Internal Medicine

## 2018-03-31 ENCOUNTER — Emergency Department (HOSPITAL_BASED_OUTPATIENT_CLINIC_OR_DEPARTMENT_OTHER)
Admission: EM | Admit: 2018-03-31 | Discharge: 2018-03-31 | Disposition: A | Discharge status: Home / Self Care | Payer: Federal, State, Local not specified - PPO | Attending: Emergency Medicine | Admitting: Emergency Medicine

## 2018-03-31 ENCOUNTER — Other Ambulatory Visit: Payer: Self-pay

## 2018-03-31 ENCOUNTER — Encounter (HOSPITAL_BASED_OUTPATIENT_CLINIC_OR_DEPARTMENT_OTHER): Payer: Self-pay | Admitting: *Deleted

## 2018-03-31 DIAGNOSIS — Z79899 Other long term (current) drug therapy: Secondary | ICD-10-CM | POA: Diagnosis not present

## 2018-03-31 DIAGNOSIS — M546 Pain in thoracic spine: Secondary | ICD-10-CM | POA: Diagnosis not present

## 2018-03-31 DIAGNOSIS — M542 Cervicalgia: Secondary | ICD-10-CM | POA: Insufficient documentation

## 2018-03-31 DIAGNOSIS — I2510 Atherosclerotic heart disease of native coronary artery without angina pectoris: Secondary | ICD-10-CM | POA: Insufficient documentation

## 2018-03-31 DIAGNOSIS — I10 Essential (primary) hypertension: Secondary | ICD-10-CM | POA: Insufficient documentation

## 2018-03-31 DIAGNOSIS — Z7982 Long term (current) use of aspirin: Secondary | ICD-10-CM | POA: Insufficient documentation

## 2018-03-31 DIAGNOSIS — M549 Dorsalgia, unspecified: Secondary | ICD-10-CM

## 2018-03-31 MED ORDER — LIDOCAINE 5 % EX PTCH
1.0000 | MEDICATED_PATCH | CUTANEOUS | Status: DC
  Filled 2018-03-31: qty 1

## 2018-03-31 MED ORDER — MORPHINE SULFATE (PF) 4 MG/ML IV SOLN
4.0000 mg | Freq: Once | INTRAVENOUS | Status: AC
  Administered 2018-03-31: 4 mg via INTRAMUSCULAR
  Filled 2018-03-31: qty 1

## 2018-03-31 MED ORDER — DEXAMETHASONE SODIUM PHOSPHATE 10 MG/ML IJ SOLN
10.0000 mg | Freq: Once | INTRAMUSCULAR | Status: AC
  Administered 2018-03-31: 10 mg via INTRAMUSCULAR
  Filled 2018-03-31: qty 1

## 2018-03-31 NOTE — ED Triage Notes (Signed)
Pain in his right shoulder and neck. Knife like pain x 4 hours. He thinks he pulled a muscle.

## 2018-03-31 NOTE — ED Provider Notes (Signed)
[facility] EMERGENCY DEPARTMENT Provider Note   CSN: [id number] Arrival date & time: 03/31/18  1757     History   Chief Complaint Chief Complaint  Patient presents with  . Neck Pain  . Shoulder Pain    HPI Terry Guzman is a 57 y.o. male who present with neck and R shoulder pain. PMH significant for PAF, CAD, HTN.  The patient states that he was vacuuming earlier today and twisted his neck and felt some pain in the right posterior neck and right shoulder blade area.  The pain was uncomfortable but manageable.  He started to stretch and this caused an acute onset of severe stabbing pain and the right neck radiating to the right shoulder blade and right shoulder.  The pain is constant and severe.  He has never had anything like this before.  He went to walk-in clinic and was prescribed Flexeril and advised to take ibuprofen.  He tried this however states that the pain was becoming more severe therefore he came to the emergency department.  He denies radiation of pain into the arm, numbness, tingling, weakness of the arms.  He denies history of chronic neck or back pain.  He did strain his lower back several years ago and was given a shot of steroids which he states did help his pain at that time.    HPI  Past Medical History:  Diagnosis Date  . Atrial fibrillation (HCC)    on Flecainide  . CAD (coronary artery disease)    minimal per cath in November 2011 with mild plaquing.   Marland Kitchen Dyspnea   . Glucose intolerance (impaired glucose tolerance)   . High risk medication use    Flecainide therapy  . HLD (hyperlipidemia)   . HTN (hypertension)     Patient Active Problem List   Diagnosis Date Noted  . GLUCOSE INTOLERANCE 08/08/2010  . CORONARY ATHEROSCLEROSIS NATIVE CORONARY ARTERY 08/08/2010  . Dyslipidemia 01/23/2010  . ATRIAL FIBRILLATION 04/30/2009  . GERD 04/30/2009  . DYSPNEA 04/30/2009  . CHEST PAIN-UNSPECIFIED 04/30/2009    Past Surgical History:    Procedure Laterality Date  . CARDIAC CATHETERIZATION    . CHOLECYSTECTOMY          Home Medications    Prior to Admission medications   Medication Sig Start Date End Date Taking? Authorizing Provider  aspirin 81 MG tablet Take 81 mg by mouth daily.     Yes [provider]  atorvastatin (LIPITOR) 10 MG tablet TAKE 1 TABLET (10 MG TOTAL) BY MOUTH DAILY. 09/07/17  Yes Pricilla Riffle, MD  esomeprazole (NEXIUM) 40 MG capsule Take 40 mg by mouth daily before breakfast.     Yes [provider]  metoprolol succinate (TOPROL-XL) 25 MG 24 hr tablet Take 1 tablet (25 mg total) by mouth daily. 01/26/18  Yes Pricilla Riffle, MD  Omega-3 Fatty Acids (FISH OIL) 1200 MG CAPS Take by mouth 2 (two) times daily.     Yes [provider]  flecainide (TAMBOCOR) 50 MG tablet TAKE 1 TABLET BY MOUTH TWICE A DAY 02/22/18   Pricilla Riffle, MD    Family History Family History  Problem Relation Age of Onset  . Heart disease Father   . Hypertension Father   . Coronary artery disease Unknown     Social History Social History   Tobacco Use  . Smoking status: Never Smoker  . Smokeless tobacco: Never Used  Substance Use Topics  . Alcohol use: No  Alcohol/week: 4.2 oz    Types: 7 Standard drinks or equivalent per week  . Drug use: No     Allergies   Clarithromycin; Ezetimibe; Levofloxacin; Other; and Statins   Review of Systems Review of Systems  Musculoskeletal: Positive for myalgias and neck pain. Negative for back pain.  Neurological: Negative for weakness and numbness.     Physical Exam Updated Vital Signs BP (!) 155/84   Pulse 76   Temp 98 F (36.7 C) (Oral)   Resp 18   Ht 5\' 7"  (1.702 m)   Wt 97.5 kg (215 lb)   SpO2 99%   BMI 33.67 kg/m   Physical Exam  Constitutional: He is oriented to person, place, and time. He appears well-developed and well-nourished. No distress.  HENT:  Head: Normocephalic and atraumatic.  Eyes: Pupils are equal, round, and  reactive to light. Conjunctivae are normal. Right eye exhibits no discharge. Left eye exhibits no discharge. No scleral icterus.  Neck: Normal range of motion.  No tenderness to palpation.  Pain is elicited with range of motion of the neck and shoulder.  Cardiovascular: Normal rate.  Pulmonary/Chest: Effort normal. No respiratory distress.  Abdominal: He exhibits no distension.  Musculoskeletal:  5 out of 5 strength of the bilateral upper extremities.  Normal sensation.  Neurological: He is alert and oriented to person, place, and time.  Skin: Skin is warm and dry.  Psychiatric: He has a normal mood and affect. His behavior is normal.  Nursing note and vitals reviewed.    ED Treatments / Results  Labs (all labs ordered are listed, but only abnormal results are displayed) Labs Reviewed - No data to display  EKG None  Radiology No results found.  Procedures Procedures (including critical care time)  Medications Ordered in ED Medications - No data to display   Initial Impression / Assessment and Plan / ED Course  I have reviewed the triage vital signs and the nursing notes.  Pertinent labs & imaging results that were available during my care of the patient were reviewed by me and considered in my medical decision making (see chart for details).  57 year old male presents with acute onset of right-sided neck and shoulder pain after twisting his neck earlier today.  He has had no relief with NSAIDs or muscle relaxer.  He has normal strength and no symptoms to suggest radiculopathy.  Will treat as a muscle strain and give him pain control with IM morphine, IM Decadron, lidocaine patch.  He is advised to follow-up with his doctor if his symptoms are persistent.  Final Clinical Impressions(s) / ED Diagnoses   Final diagnoses:  Neck pain  Upper back pain on right side    ED Discharge Orders    None       Bethel BornGekas, Alta Goding Marie, PA-C 03/31/18 Deitra Mayo1829    Pfeiffer, Marcy,  MD 03/31/18 [phone]

## 2018-03-31 NOTE — Discharge Instructions (Addendum)
Continue Ibuprofen 800mg  every 6-8 hours Continue Flexeril as needed If Lidocaine patch helps, this is available over the counter Please follow up with your doctor

## 2018-03-31 NOTE — ED Notes (Signed)
Pt. Reports he has been taking medicine since noon today and went to urgent care at noon then came here due to no relief.  Pt. Was moving furniture at home and cleaning.  Pt. Felt pain in the upper back and went to stretch then felt a pop and pain in the R side neck and upper back like a knife he states in the R shoulder blade area.  Pt. Reports no relief with any pain meds.  Pt. Able to move the R arm no problem with ROM.

## 2018-06-05 ENCOUNTER — Other Ambulatory Visit: Payer: Self-pay | Admitting: Internal Medicine

## 2018-06-29 ENCOUNTER — Other Ambulatory Visit: Payer: Self-pay | Admitting: Internal Medicine

## 2018-10-13 ENCOUNTER — Other Ambulatory Visit: Payer: Self-pay | Admitting: Internal Medicine

## 2018-11-18 ENCOUNTER — Other Ambulatory Visit: Payer: Self-pay | Admitting: Internal Medicine

## 2019-01-03 ENCOUNTER — Telehealth: Payer: Self-pay

## 2019-01-03 NOTE — Telephone Encounter (Signed)
   Pt reports he is just getting over the flu and agreeable to postponing his appt at this time.  Primary Cardiologist:  No primary care provider on file.   Patient contacted.  History reviewed.  No symptoms to suggest any unstable cardiac conditions.  Based on discussion, with current pandemic situation, we will be postponing this appointment for Terry Guzman with a plan for f/u in 12 wks or sooner if feasible/necessary.  If symptoms change, he has been instructed to contact our office.   Routing to C19 CANCEL pool for tracking (P CV DIV CV19 CANCEL - reason for visit "other.") and assigning priority (1 = 4-6 wks, 2 = 6-12 wks, 3 = >12 wks).   Leanord Hawking, RN  01/03/2019 12:24 PM         .

## 2019-01-08 ENCOUNTER — Other Ambulatory Visit: Payer: Self-pay | Admitting: Internal Medicine

## 2019-01-09 ENCOUNTER — Ambulatory Visit: Payer: Federal, State, Local not specified - PPO | Admitting: Internal Medicine

## 2019-01-12 NOTE — Telephone Encounter (Signed)
Call placed to pt trying to reschedule appt that was cancelled due to covid19. Left a message for pt to call back, Pt can be scheduled with APP on Dr. Charlott Rakes team, or 1st APP available.

## 2019-01-16 ENCOUNTER — Telehealth: Payer: Self-pay | Admitting: Internal Medicine

## 2019-01-16 NOTE — Telephone Encounter (Signed)
Left message for patient to call back re: rescheduling his April appointment.

## 2019-01-18 NOTE — Telephone Encounter (Signed)
Pt called back on 4/21. Spoke with scheduling and has been rescheduled for Aug with Dr. Tenny Craw.

## 2019-04-29 ENCOUNTER — Other Ambulatory Visit: Payer: Self-pay | Admitting: Internal Medicine

## 2019-05-03 ENCOUNTER — Other Ambulatory Visit: Payer: Self-pay | Admitting: Internal Medicine

## 2019-05-15 ENCOUNTER — Ambulatory Visit: Payer: Federal, State, Local not specified - PPO | Admitting: Internal Medicine

## 2019-06-06 ENCOUNTER — Encounter: Payer: Self-pay | Admitting: Cardiology

## 2019-06-06 ENCOUNTER — Encounter (INDEPENDENT_AMBULATORY_CARE_PROVIDER_SITE_OTHER): Payer: Self-pay

## 2019-06-06 ENCOUNTER — Other Ambulatory Visit: Payer: Self-pay

## 2019-06-06 ENCOUNTER — Ambulatory Visit: Payer: Federal, State, Local not specified - PPO | Admitting: Cardiology

## 2019-06-06 VITALS — BP 128/68 | HR 73 | Ht 67.0 in | Wt 229.1 lb

## 2019-06-06 DIAGNOSIS — E785 Hyperlipidemia, unspecified: Secondary | ICD-10-CM

## 2019-06-06 DIAGNOSIS — I480 Paroxysmal atrial fibrillation: Secondary | ICD-10-CM

## 2019-06-06 DIAGNOSIS — I10 Essential (primary) hypertension: Secondary | ICD-10-CM | POA: Diagnosis not present

## 2019-06-06 DIAGNOSIS — I2510 Atherosclerotic heart disease of native coronary artery without angina pectoris: Secondary | ICD-10-CM | POA: Diagnosis not present

## 2019-06-06 NOTE — Progress Notes (Signed)
Cardiology Office Note:    Date:  06/06/2019   ID:  Terry Guzman, DOB 1960-12-15, MRN [id number]  PCP:  Terry Booze, NP  Cardiologist:  Dorris Carnes, MD  Referring MD: Wandra Mannan, NP   Chief Complaint  Patient presents with  . Follow-up  . Coronary Artery Disease  . Atrial Fibrillation  . Hypertension    History of Present Illness:    Terry Guzman is a 58 y.o. male with a past medical history significant for PAF (last documented spell in 2007), mild CAD (by cath in 2011) and hypertension.  Terry Guzman is here today for 53-month follow-up. Statin was increased to 20 mg in January by his pcp.  His previous 2 episodes of afib were both after running a marathon. He has had no afib since being placed on flecainide. He feels that he would know if it occurred. He still jogs for about 30 minutes about 3 times per week. No chest discomfort or shotness of breath. No lightheadedness or syncope. No orthpnea, PND or edema.   He says that labs were done including lipid panel at his annual physical in January. I will request those results.   BMet, CBC, Lipids  Past Medical History:  Diagnosis Date  . Atrial fibrillation (Terry Guzman)    on Flecainide  . CAD (coronary artery disease)    minimal per cath in November 2011 with mild plaquing.   Marland Kitchen Dyspnea   . Glucose intolerance (impaired glucose tolerance)   . High risk medication use    Flecainide therapy  . HLD (hyperlipidemia)   . HTN (hypertension)     Past Surgical History:  Procedure Laterality Date  . CARDIAC CATHETERIZATION    . CHOLECYSTECTOMY      Current Medications: Current Meds  Medication Sig  . aspirin 81 MG tablet Take 81 mg by mouth daily.    Marland Kitchen atorvastatin (LIPITOR) 20 MG tablet Take 20 mg by mouth daily.  Marland Kitchen esomeprazole (NEXIUM) 40 MG capsule Take 40 mg by mouth daily before breakfast.    . flecainide (TAMBOCOR) 50 MG tablet TAKE 1 TABLET BY MOUTH TWICE A DAY  . metoprolol succinate (TOPROL-XL) 25  MG 24 hr tablet Take 1 tablet (25 mg total) by mouth daily. Please keep upcoming appt for future refills. Thank you  . Omega-3 Fatty Acids (FISH OIL) 1200 MG CAPS Take by mouth 2 (two) times daily.    . sildenafil (REVATIO) 20 MG tablet Take 20 mg by mouth as needed.     Allergies:   Clarithromycin, Ezetimibe, Levofloxacin, Other, and Statins   Social History   Socioeconomic History  . Marital status: Married    Spouse name: Not on file  . Number of children: Not on file  . Years of education: Not on file  . Highest education level: Not on file  Occupational History  . Occupation: Print production planner  Social Needs  . Financial resource strain: Not on file  . Food insecurity    Worry: Not on file    Inability: Not on file  . Transportation needs    Medical: Not on file    Non-medical: Not on file  Tobacco Use  . Smoking status: Never Smoker  . Smokeless tobacco: Never Used  Substance and Sexual Activity  . Alcohol use: No    Alcohol/week: 7.0 standard drinks    Types: 7 Standard drinks or equivalent per week  . Drug use: No  . Sexual activity: Yes  Lifestyle  . Physical activity  Days per week: Not on file    Minutes per session: Not on file  . Stress: Not on file  Relationships  . Social Musician on phone: Not on file    Gets together: Not on file    Attends religious service: Not on file    Active member of club or organization: Not on file    Attends meetings of clubs or organizations: Not on file    Relationship status: Not on file  Other Topics Concern  . Not on file  Social History Narrative  . Not on file     Family History: The patient's family history includes Coronary artery disease in his unknown relative; Heart disease in his father; Hypertension in his father. ROS:   Please see the history of present illness.     All other systems reviewed and are negative.  EKGs/Labs/Other Studies Reviewed:    The following studies were reviewed today:  none   EKG:  EKG is ordered today.  The ekg ordered today demonstrates normal sinus rhythm, 73 bpm, QTC 434  Recent Labs: No results found for requested labs within last 8760 hours.   Recent Lipid Panel    Component Value Date/Time   CHOL 180 12/30/2017 1701   TRIG 147 12/30/2017 1701   HDL 47 12/30/2017 1701   CHOLHDL 3.8 12/30/2017 1701   CHOLHDL 4 03/17/2012 0854   VLDL 26.6 03/17/2012 0854   LDLCALC 104 (H) 12/30/2017 1701    Physical Exam:    VS:  BP 128/68   Pulse 73   Ht 5\' 7"  (1.702 m)   Wt 229 lb 1.9 oz (103.9 kg)   SpO2 98%   BMI 35.89 kg/m     Wt Readings from Last 3 Encounters:  06/06/19 229 lb 1.9 oz (103.9 kg)  03/31/18 215 lb (97.5 kg)  12/30/17 226 lb (102.5 kg)     Physical Exam  Constitutional: He is oriented to person, place, and time. He appears well-developed and well-nourished. No distress.  HENT:  Head: Normocephalic and atraumatic.  Neck: Normal range of motion. No JVD present.  Cardiovascular: Normal rate, regular rhythm, normal heart sounds and intact distal pulses. Exam reveals no gallop and no friction rub.  No murmur heard. Pulmonary/Chest: Effort normal and breath sounds normal. No respiratory distress. He has no wheezes. He has no rales.  Abdominal: Soft. Bowel sounds are normal.  Musculoskeletal: Normal range of motion.        General: No deformity or edema.  Neurological: He is alert and oriented to person, place, and time.  Skin: Skin is warm.  Psychiatric: His behavior is normal. Judgment and thought content normal.  Vitals reviewed.    ASSESSMENT:    1. Coronary artery disease involving native coronary artery of native heart without angina pectoris   2. PAF (paroxysmal atrial fibrillation) (HCC)   3. Hyperlipidemia, unspecified hyperlipidemia type   4. Essential (primary) hypertension    PLAN:    In order of problems listed above:  CAD: Mild by cath in 2011.  Medical therapy includes aspirin and statin.  Patient is quite  active with no exertional symptoms.  Paroxysmal atrial fibrillation: Maintaining sinus rhythm on flecainide and beta-blocker.  Patient has had no symptoms of A. fib in many years.  Hyperlipidemia: On atorvastatin 20 mg daily, was increased by his PCP in January based on labs.  I will request labs from his PCP.  Hypertension: On Toprol-XL 25 mg daily.  Blood pressure  well controlled.  Labs followed by PCP.  Will request labs from PCP  Medication Adjustments/Labs and Tests Ordered: Current medicines are reviewed at length with the patient today.  Concerns regarding medicines are outlined above. Labs and tests ordered and medication changes are outlined in the patient instructions below:  Patient Instructions  Medication Instructions:  Your physician recommends that you continue on your current medications as directed. Please refer to the Current Medication list given to you today.  If you need a refill on your cardiac medications before your next appointment, please call your pharmacy.   Lab work: None   If you have labs (blood work) drawn today and your tests are completely normal, you will receive your results only by: Marland Kitchen. MyChart Message (if you have MyChart) OR . A paper copy in the mail If you have any lab test that is abnormal or we need to change your treatment, we will call you to review the results.  Testing/Procedures: None   Follow-Up: At [facility], you and your health needs are our priority.  As part of our continuing mission to provide you with exceptional heart care, we have created designated Provider Care Teams.  These Care Teams include your primary Cardiologist (physician) and Advanced Practice Providers (APPs -  Physician Assistants and Nurse Practitioners) who all work together to provide you with the care you need, when you need it. You will need a follow up appointment in:  12 months.  Please call our office 2 months in advance to schedule this appointment.  You  may see Dietrich PatesPaula Ross, MD or one of the following Advanced Practice Providers on your designated Care Team: Tereso NewcomerScott Weaver, PA-C Vin DonovanBhagat, [place] JerseyPA-C . Berton BonJanine Donda Friedli, NP  Any Other Special Instructions Will Be Listed Below (If Applicable).  Lifestyle Modifications to Prevent and Treat Heart Disease -Recommend heart healthy/Mediterranean diet, with whole grains, fruits, vegetables, fish, lean meats, nuts, olive oil and avocado oil.  -Limit salt intake to less than 1500 mg per day.  -Recommend moderate walking, starting slowly with a few minutes and working up to 3-5 times/week for 30-50 minutes each session. Aim for at least 150 minutes.week. Goal should be pace of 3 miles/hours, or walking 1.5 miles in 30 minutes -Recommend avoidance of tobacco products. Avoid excess alcohol. -Keep blood pressure well controlled, ideally less than 130/80.      Signed, Berton BonJanine Ladd Cen, NP  06/06/2019 8:28 AM    [facility]

## 2019-06-06 NOTE — Addendum Note (Signed)
Addended by: Jeremy Johann on: 06/06/2019 08:58 AM   Modules accepted: Orders

## 2019-06-06 NOTE — Patient Instructions (Addendum)
Medication Instructions:  Your physician recommends that you continue on your current medications as directed. Please refer to the Current Medication list given to you today.  If you need a refill on your cardiac medications before your next appointment, please call your pharmacy.   Lab work: None   If you have labs (blood work) drawn today and your tests are completely normal, you will receive your results only by: . MyChart Message (if you have MyChart) OR . A paper copy in the mail If you have any lab test that is abnormal or we need to change your treatment, we will call you to review the results.  Testing/Procedures: None   Follow-Up: At [facility], you and your health needs are our priority.  As part of our continuing mission to provide you with exceptional heart care, we have created designated Provider Care Teams.  These Care Teams include your primary Cardiologist (physician) and Advanced Practice Providers (APPs -  Physician Assistants and Nurse Practitioners) who all work together to provide you with the care you need, when you need it. You will need a follow up appointment in:  12 months.  Please call our office 2 months in advance to schedule this appointment.  You may see Paula Ross, MD or one of the following Advanced Practice Providers on your designated Care Team: Scott Weaver, PA-C Vin Bhagat, PA-C . Deborh Pense, NP  Any Other Special Instructions Will Be Listed Below (If Applicable).   Lifestyle Modifications to Prevent and Treat Heart Disease -Recommend heart healthy/Mediterranean diet, with whole grains, fruits, vegetables, fish, lean meats, nuts, olive oil and avocado oil.  -Limit salt intake to less than 1500 mg per day.  -Recommend moderate walking, starting slowly with a few minutes and working up to 3-5 times/week for 30-50 minutes each session. Aim for at least 150 minutes.week. Goal should be pace of 3 miles/hours, or walking 1.5 miles in 30  minutes -Recommend avoidance of tobacco products. Avoid excess alcohol. -Keep blood pressure well controlled, ideally less than 130/80.   

## 2019-07-26 ENCOUNTER — Other Ambulatory Visit: Payer: Self-pay | Admitting: Internal Medicine

## 2019-12-01 ENCOUNTER — Other Ambulatory Visit: Payer: Self-pay

## 2019-12-01 ENCOUNTER — Ambulatory Visit: Payer: Federal, State, Local not specified - PPO | Attending: Internal Medicine

## 2019-12-01 DIAGNOSIS — Z23 Encounter for immunization: Secondary | ICD-10-CM | POA: Insufficient documentation

## 2019-12-01 NOTE — Progress Notes (Signed)
   Covid-19 Vaccination Clinic  Name:  Terry Guzman    MRN: [id number] DOB: 03/22/1961  12/01/2019  Mr. Kahan was observed post Covid-19 immunization for 15 minutes without incident. He was provided with Vaccine Information Sheet and instruction to access the V-Safe system.   Mr. Broadfoot was instructed to call 911 with any severe reactions post vaccine: Marland Kitchen Difficulty breathing  . Swelling of face and throat  . A fast heartbeat  . A bad rash all over body  . Dizziness and weakness   Immunizations Administered    Name Date Dose VIS Date Route   Moderna COVID-19 Vaccine 12/01/2019  8:26 AM 0.5 mL 08/29/2019 Intramuscular   Manufacturer: Moderna   Lot: [id number]   NDC: 34193-790-24

## 2020-01-02 ENCOUNTER — Ambulatory Visit: Payer: Federal, State, Local not specified - PPO | Attending: Internal Medicine

## 2020-01-02 DIAGNOSIS — Z23 Encounter for immunization: Secondary | ICD-10-CM

## 2020-01-02 NOTE — Progress Notes (Signed)
   Covid-19 Vaccination Clinic  Name:  Terry Guzman    MRN: [id number] DOB: Aug 28, 1961  01/02/2020  Mr. [name] was observed post Covid-19 immunization for 15 minutes without incident. He was provided with Vaccine Information Sheet and instruction to access the V-Safe system.   Mr. [name] was instructed to call 911 with any severe reactions post vaccine: Marland Kitchen Difficulty breathing  . Swelling of face and throat  . A fast heartbeat  . A bad rash all over body  . Dizziness and weakness   Immunizations Administered    Name Date Dose VIS Date Route   Moderna COVID-19 Vaccine 01/02/2020  9:02 AM 0.5 mL 08/29/2019 Intramuscular   Manufacturer: Moderna   Lot: [id number]   NDC: 14239-532-02

## 2020-06-21 ENCOUNTER — Ambulatory Visit: Payer: Federal, State, Local not specified - PPO | Admitting: Internal Medicine

## 2020-07-23 ENCOUNTER — Other Ambulatory Visit: Payer: Self-pay | Admitting: Internal Medicine

## 2020-08-16 ENCOUNTER — Other Ambulatory Visit: Payer: Self-pay

## 2020-08-16 ENCOUNTER — Encounter: Payer: Self-pay | Admitting: Internal Medicine

## 2020-08-16 ENCOUNTER — Ambulatory Visit: Payer: Federal, State, Local not specified - PPO | Admitting: Internal Medicine

## 2020-08-16 VITALS — BP 116/68 | HR 60 | Ht 67.0 in | Wt 218.8 lb

## 2020-08-16 DIAGNOSIS — E782 Mixed hyperlipidemia: Secondary | ICD-10-CM

## 2020-08-16 DIAGNOSIS — I480 Paroxysmal atrial fibrillation: Secondary | ICD-10-CM

## 2020-08-16 DIAGNOSIS — I2510 Atherosclerotic heart disease of native coronary artery without angina pectoris: Secondary | ICD-10-CM | POA: Diagnosis not present

## 2020-08-16 MED ORDER — METOPROLOL SUCCINATE ER 25 MG PO TB24: 25.0000 mg | ORAL_TABLET | Freq: Every day | ORAL | 3 refills | Status: DC

## 2020-08-16 NOTE — Progress Notes (Signed)
Cardiology Office Note   Date:  08/16/2020   ID:  Terry Guzman, DOB [birth year], MRN [id number]  PCP:  April Manson, NP  Cardiologist:   Dietrich Pates, MD   F/U of PAF     History of Present Illness: Terry Guzman is a 59 y.o. male with a history of PAF (last documented spell in 2007), minimal CAD (by cath in 2011) and HTN   Since he was last in clinic he has done OK   He denies CP   Breathing is OK   No palpitations    Remains fairly activie    He is interested in losing wt.   Eats breakfast 630   Oatmeal  OJ    No Lunch Dinner:  chickn or fish and veg    Corn    One coke per day   Some snacks  Current Meds  Medication Sig  . aspirin 81 MG tablet Take 81 mg by mouth daily.    Marland Kitchen atorvastatin (LIPITOR) 20 MG tablet Take 20 mg by mouth daily.  Marland Kitchen esomeprazole (NEXIUM) 40 MG capsule Take 40 mg by mouth daily before breakfast.    . flecainide (TAMBOCOR) 50 MG tablet TAKE 1 TABLET BY MOUTH TWICE A DAY  . metoprolol succinate (TOPROL-XL) 25 MG 24 hr tablet Take 1 tablet (25 mg total) by mouth daily. Please keep upcoming appt in November with Dr. Tenny Craw before anymore refills. Thank you  . Omega-3 Fatty Acids (FISH OIL) 1200 MG CAPS Take by mouth 2 (two) times daily.    . sildenafil (REVATIO) 20 MG tablet Take 20 mg by mouth as needed.  Marland Kitchen XIIDRA 5 % SOLN Place 1 drop into both eyes as directed.     Allergies:   Clarithromycin, Ezetimibe, Levofloxacin, Other, and Statins   Past Medical History:  Diagnosis Date  . Atrial fibrillation (HCC)    on Flecainide  . CAD (coronary artery disease)    minimal per cath in November 2011 with mild plaquing.   Marland Kitchen Dyspnea   . Glucose intolerance (impaired glucose tolerance)   . High risk medication use    Flecainide therapy  . HLD (hyperlipidemia)   . HTN (hypertension)     Past Surgical History:  Procedure Laterality Date  . CARDIAC CATHETERIZATION    . CHOLECYSTECTOMY       Social History:  The patient  reports that he  has never smoked. He has never used smokeless tobacco. He reports that he does not drink alcohol and does not use drugs.   Family History:  The patient's family history includes Coronary artery disease in his unknown relative; Heart disease in his father; Hypertension in his father.    ROS:  Please see the history of present illness. All other systems are reviewed and  Negative to the above problem except as noted.    PHYSICAL EXAM: VS:  BP 116/68   Pulse 60   Ht 5\' 7"  (1.702 m)   Wt 218 lb 12.8 oz (99.2 kg)   SpO2 97%   BMI 34.27 kg/m   GEN: Obese 59 yo in no acute distress  HEENT: normal  Neck: JVP normal  carotid bruits, Cardiac: RRR; no murmurs, No edema  Respiratory:  clear to auscultation bilaterally, normal work of breathing GI: soft, nontender, nondistended, + BS  No hepatomegaly  MS: no deformity Moving all extremities   Skin: warm and dry, no rash Neuro:  Strength and sensation are intact Psych: euthymic mood, full affect  EKG:  EKG is ordered today.  SR 60 bpm    Lipid Panel    Component Value Date/Time   CHOL 180 12/30/2017 1701   TRIG 147 12/30/2017 1701   HDL 47 12/30/2017 1701   CHOLHDL 3.8 12/30/2017 1701   CHOLHDL 4 03/17/2012 0854   VLDL 26.6 03/17/2012 0854   LDLCALC 104 (H) 12/30/2017 1701      Wt Readings from Last 3 Encounters:  08/16/20 218 lb 12.8 oz (99.2 kg)  06/06/19 229 lb 1.9 oz (103.9 kg)  03/31/18 215 lb (97.5 kg)      ASSESSMENT AND PLAN:  1  CAD  Minimal by cath  Remains asymptmaticContinue ASA and statin  2  PAF  Asymptomatic   Keep on current regmien  Check labs   3  Obesity  Reviewed diet   I would recomm he cut back on sugar, carbs   Try to eat in TRE       3  HL  Will check lipids   Continue statin  4  Obesity Discussed exercise, diet  F/U in 1 year    Current medicines are reviewed at length with the patient today.  The patient does not have concerns regarding medicines.  Signed, Dietrich Pates, MD  08/16/2020  3:25 PM    [facility] [address] [place], [place], [place]  [address] Phone: [phone]; Fax: [phone]

## 2020-08-16 NOTE — Patient Instructions (Signed)
Medication Instructions:  No changes today *If you need a refill on your cardiac medications before your next appointment, please call your pharmacy*   Lab Work: None today - call back when ready to schedule lab work (Lipomed Profile) If you have labs (blood work) drawn today and your tests are completely normal, you will receive your results only by:  MyChart Message (if you have MyChart) OR  A paper copy in the mail If you have any lab test that is abnormal or we need to change your treatment, we will call you to review the results.   Testing/Procedures: none   Follow-Up: At [facility], you and your health needs are our priority.  As part of our continuing mission to provide you with exceptional heart care, we have created designated Provider Care Teams.  These Care Teams include your primary Cardiologist (physician) and Advanced Practice Providers (APPs -  Physician Assistants and Nurse Practitioners) who all work together to provide you with the care you need, when you need it.  We recommend signing up for the patient portal called "MyChart".  Sign up information is provided on this After Visit Summary.  MyChart is used to connect with patients for Virtual Visits (Telemedicine).  Patients are able to view lab/test results, encounter notes, upcoming appointments, etc.  Non-urgent messages can be sent to your provider as well.   To learn more about what you can do with MyChart, go to [email or web address].    Your next appointment:   12 month(s)  The format for your next appointment:   In Person  Provider:   You may see Dietrich Pates, MD or one of the following Advanced Practice Providers on your designated Care Team:    Tereso Newcomer, PA-C  Chelsea Aus, [place]    Other Instructions

## 2020-08-20 NOTE — Addendum Note (Signed)
Addended by: Marlyn Corporal A on: 08/20/2020 07:56 AM   Modules accepted: Orders

## 2020-09-02 ENCOUNTER — Other Ambulatory Visit: Payer: Self-pay | Admitting: Internal Medicine

## 2021-06-18 ENCOUNTER — Other Ambulatory Visit: Payer: Self-pay | Admitting: Internal Medicine

## 2021-08-26 ENCOUNTER — Other Ambulatory Visit: Payer: Self-pay | Admitting: Internal Medicine

## 2021-09-02 NOTE — Progress Notes (Signed)
Cardiology Office Note   Date:  09/04/2021   ID:  Terry Guzman, DOB Jun 30, 1961, MRN [id number]  PCP:  Terry Manson, NP  Cardiologist:   Terry Pates, MD   F/U of PAF     History of Present Illness: Terry Guzman is a 60 y.o. male with a history of PAF (last documented spell in 2007), minimal CAD (by cath in 2011) and HTN   I saw the pt in Nov 2021  Since seen he has done OK He denies palpitations.  No afib Breathing is good   Denies CP Remains very active.    Current Meds  Medication Sig   aspirin 81 MG tablet Take 81 mg by mouth daily.     atorvastatin (LIPITOR) 20 MG tablet Take 20 mg by mouth daily.   esomeprazole (NEXIUM) 40 MG capsule Take 40 mg by mouth daily before breakfast.     flecainide (TAMBOCOR) 50 MG tablet TAKE 1 TABLET BY MOUTH TWICE A DAY   metoprolol succinate (TOPROL-XL) 25 MG 24 hr tablet TAKE 1 TABLET BY MOUTH EVERY DAY   Omega-3 Fatty Acids (FISH OIL) 1200 MG CAPS Take by mouth 2 (two) times daily.     sildenafil (REVATIO) 20 MG tablet Take 20 mg by mouth as needed.   XIFAXAN 550 MG TABS tablet Take 550 mg by mouth 3 (three) times daily.   XIIDRA 5 % SOLN Place 1 drop into both eyes as directed.     Allergies:   Clarithromycin, Ezetimibe, Levofloxacin, Other, and Statins   Past Medical History:  Diagnosis Date   Atrial fibrillation (HCC)    on Flecainide   CAD (coronary artery disease)    minimal per cath in November 2011 with mild plaquing.    Dyspnea    Glucose intolerance (impaired glucose tolerance)    High risk medication use    Flecainide therapy   HLD (hyperlipidemia)    HTN (hypertension)     Past Surgical History:  Procedure Laterality Date   CARDIAC CATHETERIZATION     CHOLECYSTECTOMY       Social History:  The patient  reports that he has never smoked. He has never used smokeless tobacco. He reports that he does not drink alcohol and does not use drugs.   Family History:  The patient's family history includes  Coronary artery disease in his unknown relative; Heart disease in his father; Hypertension in his father.    ROS:  Please see the history of present illness. All other systems are reviewed and  Negative to the above problem except as noted.    PHYSICAL EXAM: VS:  BP 120/78   Pulse (!) 58   Ht 5\' 7"  (1.702 m)   Wt 203 lb 6.4 oz (92.3 kg)   SpO2 99%   BMI 31.86 kg/m   GEN: Obese 60 yo in no acute distress  HEENT: normal  Neck: JVP normal  carotid bruits, Cardiac: RRR; no murmurs, No LE edema  Respiratory:  clear to auscultation bilaterally,  GI: soft, nontender, nondistended, + BS  No hepatomegaly  MS: no deformity Moving all extremities   Skin: warm and dry, no rash Neuro:  Strength and sensation are intact Psych: euthymic mood, full affect   EKG:  EKG is ordered today.  SB 58 bpm     Lipid Panel    Component Value Date/Time   CHOL 180 12/30/2017 1701   TRIG 147 12/30/2017 1701   HDL 47 12/30/2017 1701   CHOLHDL  3.8 12/30/2017 1701   CHOLHDL 4 03/17/2012 0854   VLDL 26.6 03/17/2012 0854   LDLCALC 104 (H) 12/30/2017 1701      Wt Readings from Last 3 Encounters:  09/04/21 203 lb 6.4 oz (92.3 kg)  08/16/20 218 lb 12.8 oz (99.2 kg)  06/06/19 229 lb 1.9 oz (103.9 kg)      ASSESSMENT AND PLAN:  1  PAF   Pt has done well  No recurrence   He was extremely symptomatic Keep on flecanide  2  CAD  Cath in 2011 showed minimal irregularities   I have recomm Ca score CT 1. Given use of flecanide and 2. To guide  statin Rx    3  Obesity  Reviewed diet   I would recomm he cut back on sugar, carbs   Try to eat in TRE     4  HL  Keep on lipitor  Tolerating   LDL 104  HDL 47     5   Weight.   Congratulated on weight loss    Current medicines are reviewed at length with the patient today.  The patient does not have concerns regarding medicines.  Signed, Terry Pates, MD  09/04/2021 3:36 PM    [facility] [address] [place], [place], [place]   [address] Phone: [phone]; Fax: [phone]

## 2021-09-04 ENCOUNTER — Other Ambulatory Visit: Payer: Self-pay | Admitting: Internal Medicine

## 2021-09-04 ENCOUNTER — Other Ambulatory Visit: Payer: Self-pay

## 2021-09-04 ENCOUNTER — Ambulatory Visit: Payer: Federal, State, Local not specified - PPO | Admitting: Internal Medicine

## 2021-09-04 ENCOUNTER — Encounter: Payer: Self-pay | Admitting: Internal Medicine

## 2021-09-04 VITALS — BP 120/78 | HR 58 | Ht 67.0 in | Wt 203.4 lb

## 2021-09-04 DIAGNOSIS — I480 Paroxysmal atrial fibrillation: Secondary | ICD-10-CM | POA: Diagnosis not present

## 2021-09-04 DIAGNOSIS — E785 Hyperlipidemia, unspecified: Secondary | ICD-10-CM

## 2021-09-04 DIAGNOSIS — E782 Mixed hyperlipidemia: Secondary | ICD-10-CM | POA: Diagnosis not present

## 2021-09-04 DIAGNOSIS — I2510 Atherosclerotic heart disease of native coronary artery without angina pectoris: Secondary | ICD-10-CM

## 2021-09-04 DIAGNOSIS — R079 Chest pain, unspecified: Secondary | ICD-10-CM

## 2021-09-04 NOTE — Patient Instructions (Signed)
Medication Instructions:  Your physician recommends that you continue on your current medications as directed. Please refer to the Current Medication list given to you today.  *If you need a refill on your cardiac medications before your next appointment, please call your pharmacy*   Lab Work: none If you have labs (blood work) drawn today and your tests are completely normal, you will receive your results only by: MyChart Message (if you have MyChart) OR A paper copy in the mail If you have any lab test that is abnormal or we need to change your treatment, we will call you to review the results.   Testing/Procedures: Calcium Score CT   Follow-Up: At [facility], you and your health needs are our priority.  As part of our continuing mission to provide you with exceptional heart care, we have created designated Provider Care Teams.  These Care Teams include your primary Cardiologist (physician) and Advanced Practice Providers (APPs -  Physician Assistants and Nurse Practitioners) who all work together to provide you with the care you need, when you need it.  We recommend signing up for the patient portal called "MyChart".  Sign up information is provided on this After Visit Summary.  MyChart is used to connect with patients for Virtual Visits (Telemedicine).  Patients are able to view lab/test results, encounter notes, upcoming appointments, etc.  Non-urgent messages can be sent to your provider as well.   To learn more about what you can do with MyChart, go to [email or web address].    Your next appointment:   1 year(s)  The format for your next appointment:   In Person  Provider:   Dietrich Pates, MD     Other Instructions

## 2021-10-02 ENCOUNTER — Inpatient Hospital Stay: Admission: RE | Admit: 2021-10-02 | Payer: Self-pay | Source: Ambulatory Visit

## 2021-10-08 ENCOUNTER — Ambulatory Visit (INDEPENDENT_AMBULATORY_CARE_PROVIDER_SITE_OTHER)
Admission: RE | Admit: 2021-10-08 | Discharge: 2021-10-08 | Disposition: A | Discharge status: Home / Self Care | Payer: Self-pay | Source: Ambulatory Visit | Attending: Internal Medicine | Admitting: Internal Medicine

## 2021-10-08 ENCOUNTER — Other Ambulatory Visit: Payer: Self-pay

## 2021-10-08 DIAGNOSIS — R079 Chest pain, unspecified: Secondary | ICD-10-CM

## 2021-10-08 DIAGNOSIS — E785 Hyperlipidemia, unspecified: Secondary | ICD-10-CM

## 2021-10-10 ENCOUNTER — Telehealth: Payer: Self-pay | Admitting: Internal Medicine

## 2021-10-10 DIAGNOSIS — E782 Mixed hyperlipidemia: Secondary | ICD-10-CM

## 2021-10-10 DIAGNOSIS — I2510 Atherosclerotic heart disease of native coronary artery without angina pectoris: Secondary | ICD-10-CM

## 2021-10-10 DIAGNOSIS — I480 Paroxysmal atrial fibrillation: Secondary | ICD-10-CM

## 2021-10-10 NOTE — Telephone Encounter (Signed)
-----   Message from Dietrich Pates V, MD sent at 10/10/2021 12:05 PM EST ----- Tried to call a couple times with results The CT scan shows he has plaquing on the coronary arteries   Calcium score is 366 (87th percentile for age) Test was not meant ot see narrowings, just if plaquing (can be in or on outside of arteries.    He was not having any symptoms when I saw him in clinic  Recomm:1.   Would get echo to look at pumping function of the heart again   The CT does not see that. 2  Need tighter control of lipids  Last LDL was 104  Should really be below 70  He is on 20 atorvastatin.  Please confirm taking   Going to 40 will probably not be adequate.    Would refer for Repatha.   3.  There is a small soft tissue density in mid back that is probably a sebaceous cyst.   Need to have someone look at (PCP) I am happy to talk to patient   Not sure when to call

## 2021-10-10 NOTE — Telephone Encounter (Signed)
Spoke with the pt re: his lab results... he ws having trouble with his cell yesterday but the problem has been fixed... he can take calls now.... he is asking to talk with Dr. Harrington Challenger re: his results... I also sent to his My Chart for his review prior to taking with her.   [phone]

## 2021-10-10 NOTE — Telephone Encounter (Signed)
Patient returned Dr. Tenny Craw call for test results.

## 2021-10-13 NOTE — Telephone Encounter (Signed)
Echo has been ordered. Referral to lipid clinic has been placed.

## 2021-10-13 NOTE — Telephone Encounter (Signed)
Called pt     He is asymptomatic   Recomm: Echo  Eval LVEF   Further testing based on this REfer to lipid clinic for Repatha initiation

## 2021-10-22 ENCOUNTER — Other Ambulatory Visit: Payer: Self-pay

## 2021-10-22 ENCOUNTER — Ambulatory Visit (HOSPITAL_COMMUNITY): Payer: Federal, State, Local not specified - PPO | Attending: Cardiovascular Disease

## 2021-10-22 DIAGNOSIS — E782 Mixed hyperlipidemia: Secondary | ICD-10-CM | POA: Diagnosis not present

## 2021-10-22 DIAGNOSIS — I480 Paroxysmal atrial fibrillation: Secondary | ICD-10-CM | POA: Diagnosis not present

## 2021-10-22 DIAGNOSIS — I2510 Atherosclerotic heart disease of native coronary artery without angina pectoris: Secondary | ICD-10-CM | POA: Insufficient documentation

## 2021-10-22 LAB — ECHOCARDIOGRAM COMPLETE
Area-P 1/2: 3.91 cm2
S' Lateral: 2.2 cm

## 2021-10-23 ENCOUNTER — Telehealth: Payer: Self-pay

## 2021-10-23 DIAGNOSIS — Z79899 Other long term (current) drug therapy: Secondary | ICD-10-CM

## 2021-10-23 DIAGNOSIS — I480 Paroxysmal atrial fibrillation: Secondary | ICD-10-CM

## 2021-10-23 DIAGNOSIS — I2510 Atherosclerotic heart disease of native coronary artery without angina pectoris: Secondary | ICD-10-CM

## 2021-10-23 NOTE — Addendum Note (Signed)
Addended by: Stephani Police on: 10/23/2021 02:40 PM   Modules accepted: Orders

## 2021-10-23 NOTE — Telephone Encounter (Signed)
Pt agreed to a GXT and verbalized understanding of his instructions.

## 2021-10-23 NOTE — Telephone Encounter (Signed)
-----   Message from Dietrich Pates V, MD sent at 10/22/2021  3:47 PM EST ----- Echo looks good   LVEF and RVEF are normal    Normal valve function Review of this and CT  I would recomm a regular GXT since he is on flecanide

## 2021-10-27 NOTE — Progress Notes (Signed)
Patient ID: Terry Guzman                 DOB: 04-23-1961                    MRN: [id number]     HPI: Terry Guzman is a 61 y.o. male patient referred to lipid clinic by Dr. Tenny Craw. PMH is significant for CAD, glucose intolerance, dyspnea, HLD, HTN, atrial fibrillation, obesity. Heart cath in 07/2010 showed mild plaque, non-obstructive CAD. At discharge, pt was started on pravastatin 40mg  daily, despite previous intolerance to Crestor, pravastatin, and Zetia. He was changed to atorvastatin 10mg  daily in 2013 and increased to atorvastatin 20mg  daily in 05/2019. Referred for coronary CT in 09/2021 which revealed a CAC score of 366 (87th percentile for age/sex). Dr. recommended an ECHO and tighter control of lipids. Referred to lipid clinic for discussion of PCSK9 inhibitors.  Pt presents to clinic in good spirits. He was under the impression he was going to be getting a cholesterol shot in office today. He is adherent to atorvastatin 20mg  daily with no side effects. He endorsed being on various statins in the past which caused him joint pain/muscle pain. Did not remember having diarrhea with Zetia, but states it was so long ago it was hard to remember. He is interested in trying a PCSK9 inhibitor to lower his cholesterol. He will be going on a trip in early March, so asked if there would be any problems with missing a dose.   Current Medications: atorvastatin 20 mg Intolerances: pravastatin 40mg  daily, atorvastatin 10mg  daily, rosuvastatin - joint pain/muscle pain; Zetia - diarrhea, fenofibrate (?) Risk Factors: elevated CAC score, CAD,  LDL goal: <70 mg/dL d/t elevated CAC score and CAD  Diet: chicken, fish, occasional pork loin, breakfast: eggs, stays away from coke, drinks lots of water and unsweet tea  Exercise: walks 2 miles every lunch   Family History: The patient's family history includes Coronary artery disease in his unknown relative; Heart disease in his father (Dad MI at 44);  Hypertension in his father; uncles with heart disease (in 28s)  Social History: The patient  reports that he has never smoked. He has never used smokeless tobacco. He reports that he does not drink alcohol and does not use drugs.   Labs: (01/09/2020): TGL 157, LDL 102 (atorvastatin 20mg  daily) (12/30/2017): TC 180, TGL 147, HDL 47, LDL 104 (atorvastatin 10 mg daily)   Past Medical History:  Diagnosis Date   Atrial fibrillation (HCC)    on Flecainide   CAD (coronary artery disease)    minimal per cath in November 2011 with mild plaquing.    Dyspnea    Glucose intolerance (impaired glucose tolerance)    High risk medication use    Flecainide therapy   HLD (hyperlipidemia)    HTN (hypertension)     Current Outpatient Medications on File Prior to Visit  Medication Sig Dispense Refill   aspirin 81 MG tablet Take 81 mg by mouth daily.       atorvastatin (LIPITOR) 20 MG tablet Take 20 mg by mouth daily.     esomeprazole (NEXIUM) 40 MG capsule Take 40 mg by mouth daily before breakfast.       flecainide (TAMBOCOR) 50 MG tablet TAKE 1 TABLET BY MOUTH TWICE A DAY 180 tablet 3   metoprolol succinate (TOPROL-XL) 25 MG 24 hr tablet TAKE 1 TABLET BY MOUTH EVERY DAY 90 tablet 0   Omega-3 Fatty Acids (FISH  OIL) 1200 MG CAPS Take by mouth 2 (two) times daily.       sildenafil (REVATIO) 20 MG tablet Take 20 mg by mouth as needed.     XIFAXAN 550 MG TABS tablet Take 550 mg by mouth 3 (three) times daily.     XIIDRA 5 % SOLN Place 1 drop into both eyes as directed.     No current facility-administered medications on file prior to visit.    Allergies  Allergen Reactions   Clarithromycin    Ezetimibe Diarrhea   Levofloxacin    Other Diarrhea   Statins     Joint pain    Assessment/Plan:  1. Hyperlipidemia - LDL is likely elevated above goal of <70 mg/dL on a moderate intensity statin. Will continue atorvastatin 20mg  daily and submit a PA for Repatha to patient's insurance. If approved, will  initiate Repatha 140 mg Hebo q2weeks. Encouraged patient to continue eating a heart healthy diet and increasing exercise as able. Plan for follow up lipid labs in 8 weeks to assess response to therapy.   Educated patient on storage, site selection and administration using demo pen.  Patient voiced understanding.  , PharmD Student assisted in this visit.  Clarice Pole, PharmD, BCACP, CDCES, CPP [facility] [address] [place], [place] Phone: [phone]; Fax: [phone] 10/28/2021 9:50 AM

## 2021-10-28 ENCOUNTER — Other Ambulatory Visit: Payer: Self-pay

## 2021-10-28 ENCOUNTER — Telehealth: Payer: Self-pay | Admitting: Pharmacist

## 2021-10-28 ENCOUNTER — Ambulatory Visit: Payer: Federal, State, Local not specified - PPO | Admitting: Pharmacist

## 2021-10-28 DIAGNOSIS — I4891 Unspecified atrial fibrillation: Secondary | ICD-10-CM | POA: Diagnosis not present

## 2021-10-28 DIAGNOSIS — I2510 Atherosclerotic heart disease of native coronary artery without angina pectoris: Secondary | ICD-10-CM | POA: Diagnosis not present

## 2021-10-28 DIAGNOSIS — E785 Hyperlipidemia, unspecified: Secondary | ICD-10-CM

## 2021-10-28 NOTE — Patient Instructions (Signed)
It was nice to meet you today!  Your LDL is elevated above goal of <70.  We will submit Repatha to your insurance and will let you know when we hear back.   Repatha is a PCSK9 Inhibitor that is a twice monthly injection. Keep the medication in the fridge until you are ready to use it. Inject the medication 2 inches away from your belly button and dispose in the trash after use.   We will have you come back in to check your lipid levels on April 18th, 2023 after 7:30 AM, fasting.

## 2021-10-28 NOTE — Telephone Encounter (Signed)
Terry Guzman (Key: [id number]) pa for repatha submitted but may be denied as I couldn't find recent labs anywhere.   Lipid panel clinic collect was ordered and will be ready for scheduling when the pa is approved. Copay card for repatha is as follows: We just need a little more information from you. Call RepathaReady at [phone]) to see if you qualify for the Repatha Copay Card.

## 2021-10-28 NOTE — Telephone Encounter (Signed)
Please complete prior authorization for: ° °Name of medication, dose, and frequency Repatha 140mg sq q 14 days ° °Lab Orders Requested? yes ° °Which labs? Lipid panel ° °Estimated date for labs to be scheduled 2-3 months ° °Does patient need activated copay card? yes  °

## 2021-10-29 NOTE — Addendum Note (Signed)
Addended by: Fay Records on: 10/29/2021 05:39 PM   Modules accepted: Orders

## 2021-10-30 ENCOUNTER — Other Ambulatory Visit: Payer: Self-pay | Admitting: Internal Medicine

## 2021-11-04 ENCOUNTER — Telehealth: Payer: Self-pay | Admitting: Pharmacist

## 2021-11-04 ENCOUNTER — Ambulatory Visit (INDEPENDENT_AMBULATORY_CARE_PROVIDER_SITE_OTHER): Payer: Federal, State, Local not specified - PPO

## 2021-11-04 ENCOUNTER — Other Ambulatory Visit: Payer: Self-pay

## 2021-11-04 DIAGNOSIS — I480 Paroxysmal atrial fibrillation: Secondary | ICD-10-CM

## 2021-11-04 DIAGNOSIS — Z79899 Other long term (current) drug therapy: Secondary | ICD-10-CM

## 2021-11-04 DIAGNOSIS — I2510 Atherosclerotic heart disease of native coronary artery without angina pectoris: Secondary | ICD-10-CM

## 2021-11-04 LAB — EXERCISE TOLERANCE TEST
Angina Index: 0
Duke Treadmill Score: 9
Estimated workload: 10.1
Exercise duration (min): 9 min
Exercise duration (sec): 0 s
MPHR: 159 {beats}/min
Peak HR: 142 {beats}/min
Percent HR: 89 %
RPE: 15
Rest HR: 74 {beats}/min
ST Depression (mm): 0 mm

## 2021-11-04 NOTE — Telephone Encounter (Signed)
Unsure what happened with initial PA. I have submitted another prior authorization. Patient aware and apologized for the delay and lack of follow up.

## 2021-11-04 NOTE — Telephone Encounter (Signed)
Pt c/o medication issue:  1. Name of Medication: Repatha 140 MG's   2. How are you currently taking this medication (dosage and times per day)? Not currently taking   3. Are you having a reaction (difficulty breathing--STAT)? No   4. What is your medication issue? Patient is calling stating that the manufacturer for Repatha called him today wanting to know how he is doing on the medication. Patient is confused by this due to him not being on it and being told at his last appt that it was being submitted to insurance for PA approval and he'd hear back. Nor the patient or the pharmacy has heard anything about the medication since. Patient is on the way to the office for his test scheduled for today, is hoping to get this sorted out while there.

## 2021-11-06 MED ORDER — REPATHA SURECLICK 140 MG/ML ~~LOC~~ SOAJ: 1.0000 "pen " | SUBCUTANEOUS | 11 refills | Status: DC

## 2021-11-06 NOTE — Telephone Encounter (Signed)
PA approved through 11/06/22 Patient called. He knows to call [phone] for copay card. Rx sent to [facility]. Repeat labs in 3 months.

## 2021-11-10 ENCOUNTER — Encounter: Payer: Self-pay | Admitting: Internal Medicine

## 2022-01-13 ENCOUNTER — Other Ambulatory Visit: Payer: Federal, State, Local not specified - PPO

## 2022-01-13 DIAGNOSIS — E785 Hyperlipidemia, unspecified: Secondary | ICD-10-CM

## 2022-01-13 LAB — LIPID PANEL
Chol/HDL Ratio: 2.4 ratio (ref 0.0–5.0)
Cholesterol, Total: 99 mg/dL — ABNORMAL LOW (ref 100–199)
HDL: 42 mg/dL (ref >39)
LDL Chol Calc (NIH): 34 mg/dL (ref 0–99)
Triglycerides: 131 mg/dL (ref 0–149)
VLDL Cholesterol Cal: 23 mg/dL (ref 5–40)

## 2022-01-18 ENCOUNTER — Other Ambulatory Visit: Payer: Self-pay | Admitting: Internal Medicine

## 2022-08-19 ENCOUNTER — Other Ambulatory Visit: Payer: Self-pay | Admitting: Internal Medicine

## 2022-08-24 ENCOUNTER — Telehealth: Payer: Self-pay | Admitting: *Deleted

## 2022-08-24 NOTE — Telephone Encounter (Signed)
    Patient Name: Terry Guzman  DOB: 08-Jun-1961 MRN: [id number]  Primary Cardiologist: Dietrich Pates, MD  Chart reviewed as part of pre-operative protocol coverage.   The patient already has an upcoming appointment scheduled 09/08/22 with Dr. Tenny Craw at which time this clearance should be addressed. (Procedure date of 09/16/22 falls after appointment.) The visit is with Dr. Tenny Craw herself who will be able to weigh in on holding aspirin as requested.   - I added "preop" comment to appointment notes so that provider is aware to address at time of OV. Per office protocol, the provider seeing this patient should forward their finalized clearance decision to requesting party below.  - Will fax update to requesting surgeon so they are aware. Will remove from preop box as separate preop APP input not necessary at this time.  Laurann Montana, PA-C 08/24/2022, 4:39 PM

## 2022-08-24 NOTE — Telephone Encounter (Signed)
   Pre-operative Risk Assessment    Patient Name: Terry Guzman  DOB: [birth year] MRN: [id number]      Request for Surgical Clearance    Procedure:   ECTROPION REPAIR-B/L  Date of Surgery:  Clearance 09/16/22                                 Surgeon:  DR. Dairl Ponder Surgeon's Group or Practice Name:  [facility] Phone number:  [phone] Fax number:  [phone]   Type of Clearance Requested:   - Medical ; ASA HOLD x 7 DAYS PRIOR AND 3 DAYS POST OP FOR A TOTAL OF 10 DAY HOLD    Type of Anesthesia:   IV SEDATION   Additional requests/questions:    [name]   08/24/2022, 4:13 PM

## 2022-08-25 NOTE — Telephone Encounter (Signed)
I have reviewed last note    Should be OK to stop aspirin 7 days prior   Resume when safe after

## 2022-09-06 NOTE — Progress Notes (Deleted)
Cardiology Office Note   Date:  09/06/2022   ID:  Terry Guzman, DOB 1961/03/09, MRN [id number]  PCP:  April Manson, NP  Cardiologist:   Dietrich Pates, MD   F/U of PAF     History of Present Illness: Terry Guzman is a 61 y.o. male with a history of PAF (last documented spell in 2007), minimal CAD (by cath in 2011) and HTN   I saw the pt in Nov 2021  Since seen he has done OK He denies palpitations.  No afib Breathing is good   Denies CP Remains very active.    I saw the pt in Dec 2021   He wsa seen by A Congolosi since   No outpatient medications have been marked as taking for the 09/07/22 encounter (Appointment) with Pricilla Riffle, MD.     Allergies:   Clarithromycin, Ezetimibe, Levofloxacin, Other, and Statins   Past Medical History:  Diagnosis Date   Atrial fibrillation (HCC)    on Flecainide   CAD (coronary artery disease)    minimal per cath in November 2011 with mild plaquing.    Dyspnea    Glucose intolerance (impaired glucose tolerance)    High risk medication use    Flecainide therapy   HLD (hyperlipidemia)    HTN (hypertension)     Past Surgical History:  Procedure Laterality Date   CARDIAC CATHETERIZATION     CHOLECYSTECTOMY       Social History:  The patient  reports that he has never smoked. He has never used smokeless tobacco. He reports that he does not drink alcohol and does not use drugs.   Family History:  The patient's family history includes Coronary artery disease in his unknown relative; Heart disease in his father; Hypertension in his father.    ROS:  Please see the history of present illness. All other systems are reviewed and  Negative to the above problem except as noted.    PHYSICAL EXAM: VS:  There were no vitals taken for this visit.  GEN: Obese 61 yo in no acute distress  HEENT: normal  Neck: JVP normal  carotid bruits, Cardiac: RRR; no murmurs, No LE edema  Respiratory:  clear to auscultation bilaterally,   GI: soft, nontender, nondistended, + BS  No hepatomegaly  MS: no deformity Moving all extremities   Skin: warm and dry, no rash Neuro:  Strength and sensation are intact Psych: euthymic mood, full affect   EKG:  EKG is ordered today.  SB 58 bpm     Lipid Panel    Component Value Date/Time   CHOL 99 (L) 01/13/2022 0759   TRIG 131 01/13/2022 0759   HDL 42 01/13/2022 0759   CHOLHDL 2.4 01/13/2022 0759   CHOLHDL 4 03/17/2012 0854   VLDL 26.6 03/17/2012 0854   LDLCALC 34 01/13/2022 0759      Wt Readings from Last 3 Encounters:  09/04/21 203 lb 6.4 oz (92.3 kg)  08/16/20 218 lb 12.8 oz (99.2 kg)  06/06/19 229 lb 1.9 oz (103.9 kg)      ASSESSMENT AND PLAN:  1  PAF   Pt has done well  No recurrence   He was extremely symptomatic Keep on flecanide  2  CAD  Cath in 2011 showed minimal irregularities   I have recomm Ca score CT 1. Given use of flecanide and 2. To guide  statin Rx    3  Obesity  Reviewed diet   I would recomm he  cut back on sugar, carbs   Try to eat in TRE     4  HL  Keep on lipitor  Tolerating   LDL 104  HDL 47     5   Weight.   Congratulated on weight loss    Current medicines are reviewed at length with the patient today.  The patient does not have concerns regarding medicines.  Signed, Dietrich Pates, MD  09/06/2022 9:49 PM    [facility] [address] [place], [place], [place]  [address] Phone: [phone]; Fax: [phone]

## 2022-09-07 ENCOUNTER — Ambulatory Visit: Payer: Federal, State, Local not specified - PPO | Admitting: Internal Medicine

## 2022-09-08 ENCOUNTER — Ambulatory Visit: Payer: Federal, State, Local not specified - PPO | Admitting: Internal Medicine

## 2022-09-23 ENCOUNTER — Telehealth: Payer: Self-pay | Admitting: Internal Medicine

## 2022-09-23 ENCOUNTER — Other Ambulatory Visit: Payer: Self-pay | Admitting: Pharmacist

## 2022-09-23 ENCOUNTER — Other Ambulatory Visit: Payer: Self-pay | Admitting: Internal Medicine

## 2022-09-23 ENCOUNTER — Encounter: Payer: Self-pay | Admitting: Internal Medicine

## 2022-09-23 MED ORDER — REPATHA SURECLICK 140 MG/ML ~~LOC~~ SOAJ: 1.0000 | SUBCUTANEOUS | 11 refills | Status: DC

## 2022-09-23 NOTE — Telephone Encounter (Signed)
Called pharmacy, no issue with PA, apparently they said new rx needed even though the one from Feb had a year's worth of refills on it. This has been done.

## 2022-09-23 NOTE — Telephone Encounter (Signed)
Pt sent in message stating new PA required. We already have a valid one on file through next February. Will try resubmitting.

## 2022-09-23 NOTE — Telephone Encounter (Signed)
Pt c/o medication issue:  1. Name of Medication:   Evolocumab (REPATHA SURECLICK) 140 MG/ML SOAJ    2. How are you currently taking this medication (dosage and times per day)?  Inject 1 pen into the skin every 14 (fourteen) days.   3. Are you having a reaction (difficulty breathing--STAT)? No  4. What is your medication issue? Pt states that insurance is needing Prior Auth for medication. He states that it needs to be renewed for another year. Please advise

## 2022-09-23 NOTE — Telephone Encounter (Addendum)
Shouldn't be an issue filling, we already have authorization on file through 11/06/22. Will try resubmitting to see if there was an issue with it. Key [id number]. Pt updated via MyChart message he sent in.

## 2022-09-23 NOTE — Telephone Encounter (Signed)
Documented and addressed in mychart encounter.

## 2022-10-04 NOTE — Progress Notes (Deleted)
Cardiology Office Note   Date:  10/04/2022   ID:  Terry Guzman, DOB Jul 17, 1961, MRN [id number]  PCP:  April Manson, NP  Cardiologist:   Dietrich Pates, MD   F/U of PAF     History of Present Illness: Terry Guzman is a 62 y.o. male with a history of PAF (last documented spell in 2007), minimal CAD (by cath in 2011) and HTN   I saw the pt in Dec 2022    No outpatient medications have been marked as taking for the 10/05/22 encounter (Appointment) with Pricilla Riffle, MD.     Allergies:   Clarithromycin, Ezetimibe, Levofloxacin, Other, and Statins   Past Medical History:  Diagnosis Date   Atrial fibrillation (HCC)    on Flecainide   CAD (coronary artery disease)    minimal per cath in November 2011 with mild plaquing.    Dyspnea    Glucose intolerance (impaired glucose tolerance)    High risk medication use    Flecainide therapy   HLD (hyperlipidemia)    HTN (hypertension)     Past Surgical History:  Procedure Laterality Date   CARDIAC CATHETERIZATION     CHOLECYSTECTOMY       Social History:  The patient  reports that he has never smoked. He has never used smokeless tobacco. He reports that he does not drink alcohol and does not use drugs.   Family History:  The patient's family history includes Coronary artery disease in his unknown relative; Heart disease in his father; Hypertension in his father.    ROS:  Please see the history of present illness. All other systems are reviewed and  Negative to the above problem except as noted.    PHYSICAL EXAM: VS:  There were no vitals taken for this visit.  GEN: Obese 62 yo in no acute distress  HEENT: normal  Neck: JVP normal  carotid bruits, Cardiac: RRR; no murmurs, No LE edema  Respiratory:  clear to auscultation bilaterally,  GI: soft, nontender, nondistended, + BS  No hepatomegaly  MS: no deformity Moving all extremities   Skin: warm and dry, no rash Neuro:  Strength and sensation are intact Psych:  euthymic mood, full affect   EKG:  EKG is ordered today.  SB 58 bpm     Lipid Panel    Component Value Date/Time   CHOL 99 (L) 01/13/2022 0759   TRIG 131 01/13/2022 0759   HDL 42 01/13/2022 0759   CHOLHDL 2.4 01/13/2022 0759   CHOLHDL 4 03/17/2012 0854   VLDL 26.6 03/17/2012 0854   LDLCALC 34 01/13/2022 0759      Wt Readings from Last 3 Encounters:  09/04/21 203 lb 6.4 oz (92.3 kg)  08/16/20 218 lb 12.8 oz (99.2 kg)  06/06/19 229 lb 1.9 oz (103.9 kg)      ASSESSMENT AND PLAN:  1  PAF   Pt has done well  No recurrence   He was extremely symptomatic Keep on flecanide  2  CAD  Cath in 2011 showed minimal irregularities   I have recomm Ca score CT 1. Given use of flecanide and 2. To guide  statin Rx    3  Obesity  Reviewed diet   I would recomm he cut back on sugar, carbs   Try to eat in TRE     4  HL  Keep on lipitor  Tolerating   LDL 104  HDL 47     5   Weight.  Congratulated on weight loss    Current medicines are reviewed at length with the patient today.  The patient does not have concerns regarding medicines.  Signed, Dorris Carnes, MD  10/04/2022 11:07 PM    [facility], [place], [place]  [address] Phone: [phone]; Fax: [phone]

## 2022-10-05 ENCOUNTER — Ambulatory Visit: Payer: Federal, State, Local not specified - PPO | Admitting: Internal Medicine

## 2022-10-26 ENCOUNTER — Ambulatory Visit: Payer: Federal, State, Local not specified - PPO | Admitting: Internal Medicine

## 2022-10-29 ENCOUNTER — Telehealth: Payer: Self-pay | Admitting: *Deleted

## 2022-10-29 NOTE — Telephone Encounter (Signed)
   Pre-operative Risk Assessment    Patient Name: Terry Guzman  DOB: Apr 18, 1961 MRN: [id number]      Request for Surgical Clearance    Procedure:   BLEPHAROPLASTY, BOTH LOWER LIDS-FUNCTIONAL  Date of Surgery:  Clearance 11/18/22                                 Surgeon:  DR. Delia Chimes Surgeon's Group or Practice Name:  [facility]  Phone number:  [phone] EXT [phone] Fax number:  [phone]   Type of Clearance Requested:   - Medical ; ASA 7-10 DAYS PRIOR TO SURGERY   Type of Anesthesia:  Not Indicated; LEFT MESSAGE TO CALL BACK WITH TYPE OF ANESTHESIA   Additional requests/questions:    Jiles Prows   10/29/2022, 3:53 PM

## 2022-10-30 NOTE — Telephone Encounter (Signed)
ADDENDUM: PER ASHLEY, NP AT [facility].   ANESTHESIA: IV SEDATION; TOPICAL [facility]

## 2022-10-30 NOTE — Telephone Encounter (Signed)
Patient is currently scheduled to see Dr. Harrington Challenger on 2/20, her surgery is on 2/21.  He was previously scheduled to see Dr. Harrington Challenger in December, however this was canceled.  Looks like there have been a total of 6 canceled visits since December.  The request is to hold aspirin for 7 days prior to the procedure.  Will defer final clearance to MD.  Please inform the patient that as long as he does not have any chest pain or worsening dyspnea, he may hold aspirin for 7 days prior to the procedure, please keep his follow-up with Dr. Harrington Challenger who will review cardiac clearance during office visit.

## 2022-11-04 NOTE — Telephone Encounter (Signed)
I s/w the pt and he he has been informed of ASA hold time x 7 days, pt will take ASA thru 11/10/22, including 11/10/22. Pt will then hold ASA until after procedure. Pt will resume ASA once Dr. Hollice Espy advises it is safe to resume. Pt verbally understands instructions. Did discuss with the pt if he has been having any worsening dyspnea or any chest pain. Pt has answered no to both of these questions. I informed the pt that I will also send FYI to Dr. Samul Dada office. Once Dr. Harrington Challenger clears the pt, she will have  her nurse fax over the clearance notes. Pt thanked me for the help and the call.

## 2022-11-04 NOTE — Telephone Encounter (Signed)
Reviewed messages. Patient low risk for planned procedure   OK to hold aspirin as directed   Resume when safe surgically

## 2022-11-04 NOTE — Telephone Encounter (Signed)
Calling for udpate. Please advise

## 2022-11-05 NOTE — Telephone Encounter (Signed)
   Name: Terry Guzman  DOB: 11/03/1960  MRN: [id number]   Primary Cardiologist: Dorris Carnes, MD  Chart reviewed as part of pre-operative protocol coverage. Patient was contacted 11/04/22  in reference to pre-operative risk assessment for pending surgery as outlined below.   Therefore, based on ACC/AHA guidelines, the patient would be at acceptable risk for the planned procedure without further cardiovascular testing per Dr. Harrington Challenger.  The patient was advised that if he develops new symptoms prior to surgery to contact our office to arrange for a follow-up visit, and he verbalized understanding.  -Patient was advised to hold aspirin 7 days prior to procedure and resume when surgically safe.  I will route this recommendation to the requesting party via Epic fax function and remove from pre-op pool. Please call with questions.  Mable Fill, Marissa Nestle, NP 11/05/2022, 7:26 AM

## 2022-11-12 NOTE — Progress Notes (Signed)
Office Visit    Patient Name: Terry Guzman Date of Encounter: 11/13/2022  PCP:  Jettie Booze, NP   [facility]  Cardiologist:  Dorris Carnes, MD  Advanced Practice Provider:  No care team member to display Electrophysiologist:  None   HPI    Terry Guzman is a 61 y.o. male with a past medical history of PAF (last documented spell 2007), minimal CAD (by cardiac catheterization 2011) and HTN presents today for follow-up visit.  He was seen by Dr. Harrington Challenger November 2021.  Last seen December 2022 and was doing well at that time.  He denied palpitations.  No atrial fibrillation.  Breathing was good.  Denied any chest pain.  Remained very active.  Today, he states he has not had any issues with his heart.  He is having eye surgery next week and also needs right knee surgery for a torn meniscus.  He has not had any issues with atrial fibrillation and is asymptomatic.  He knows already he needs to be off his aspirin for 7 days and stopped it on the 13th.  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  He is still able to run but currently not running due to his meniscus tear.  His activity level well exceeds 4 METS.  Reports no shortness of breath nor dyspnea on exertion. Reports no chest pain, pressure, or tightness. No edema, orthopnea, PND. Reports no palpitations.    Past Medical History    Past Medical History:  Diagnosis Date   Atrial fibrillation ([place])    on Flecainide   CAD (coronary artery disease)    minimal per cath in November 2011 with mild plaquing.    Dyspnea    Glucose intolerance (impaired glucose tolerance)    High risk medication use    Flecainide therapy   HLD (hyperlipidemia)    HTN (hypertension)    Past Surgical History:  Procedure Laterality Date   CARDIAC CATHETERIZATION     CHOLECYSTECTOMY      Allergies  Allergies  Allergen Reactions   Clarithromycin    Ezetimibe Diarrhea    Levofloxacin    Other Diarrhea   Statins     Joint pain   EKGs/Labs/Other Studies Reviewed:   The following studies were reviewed today:   ETT 2/23    No ST deviation was noted.   Negative, adequate stress test.  EKG:  EKG is  ordered today.  The ekg ordered today demonstrates NSR rate 72 bpm  Recent Labs: No results found for requested labs within last 365 days.  Recent Lipid Panel    Component Value Date/Time   CHOL 99 (L) 01/13/2022 0759   TRIG 131 01/13/2022 0759   HDL 42 01/13/2022 0759   CHOLHDL 2.4 01/13/2022 0759   CHOLHDL 4 03/17/2012 0854   VLDL 26.6 03/17/2012 0854   LDLCALC 34 01/13/2022 0759    Risk Assessment/Calculations:   CHA2DS2-VASc Score = 0   This indicates a 0.2% annual risk of stroke. The patient's score is based upon: CHF History: 0 HTN History: 0 Diabetes History: 0 Stroke History: 0 Vascular Disease History: 0 Age Score: 0 Gender Score: 0      Home Medications   Current Meds  Medication Sig   aspirin 81 MG tablet Take 81 mg by mouth daily.     atorvastatin (LIPITOR) 20 MG tablet Take 20 mg by mouth daily.   esomeprazole (NEXIUM) 40 MG capsule Take 40  mg by mouth daily before breakfast.     Evolocumab (REPATHA SURECLICK) XX123456 MG/ML SOAJ Inject 140 mg into the skin every 14 (fourteen) days.   flecainide (TAMBOCOR) 50 MG tablet Take 1 tablet (50 mg total) by mouth 2 (two) times daily.   Omega-3 Fatty Acids (FISH OIL) 1200 MG CAPS Take by mouth 2 (two) times daily.     sildenafil (REVATIO) 20 MG tablet Take 20 mg by mouth as needed.   XIFAXAN 550 MG TABS tablet Take 550 mg by mouth 3 (three) times daily.   XIIDRA 5 % SOLN Place 1 drop into both eyes as directed.   [DISCONTINUED] metoprolol succinate (TOPROL-XL) 25 MG 24 hr tablet TAKE 1 TABLET BY MOUTH EVERY DAY     Review of Systems      All other systems reviewed and are otherwise negative except as noted above.  Physical Exam    VS:  BP 138/80   Pulse 72   Ht 5' 7"$  (1.702  m)   Wt 222 lb 12.8 oz (101.1 kg)   SpO2 98%   BMI 34.90 kg/m  , BMI Body mass index is 34.9 kg/m.  Wt Readings from Last 3 Encounters:  11/13/22 222 lb 12.8 oz (101.1 kg)  09/04/21 203 lb 6.4 oz (92.3 kg)  08/16/20 218 lb 12.8 oz (99.2 kg)     GEN: Well nourished, well developed, in no acute distress. HEENT: normal. Neck: Supple, no JVD, carotid bruits, or masses. Cardiac: RRR, no murmurs, rubs, or gallops. No clubbing, cyanosis, edema.  Radials/PT 2+ and equal bilaterally.  Respiratory:  Respirations regular and unlabored, clear to auscultation bilaterally. GI: Soft, nontender, nondistended. MS: No deformity or atrophy. Skin: Warm and dry, no rash. Neuro:  Strength and sensation are intact. Psych: Normal affect.  Assessment & Plan    Preop clearance  Terry Guzman's perioperative risk of a major cardiac event is 0.4% according to the Revised Cardiac Risk Index (RCRI).  Therefore, he is at low risk for perioperative complications.   His functional capacity is good at 6.61 METs according to the Duke Activity Status Index (DASI). Recommendations: According to ACC/AHA guidelines, no further cardiovascular testing needed.  The patient may proceed to surgery at acceptable risk.   Antiplatelet and/or Anticoagulation Recommendations: Aspirin can be held for 7 days prior to his surgery.  Please resume Aspirin post operatively when it is felt to be safe from a bleeding standpoint.    PAF -NSR today on Flecanide and metoprolol, tolerating well -no further issues -asymptomatic today  CAD -no chest pain or SOB -continue current medications including Lipitor 20 mg daily, aspirin 81 mg daily, flecainide 50 mg twice a day, Repatha 140 mg/milliliters every 14 days, metoprolol succinate 25 mg daily, fish oil 1200 mg twice a day  Obesity -working on getting back to running once his meniscus is repaired -he used to be a marathon runner   HL -LDL 34  -lipid panel when he sees his  primary -continue current medications         Disposition: Follow up 1 year  with Dorris Carnes, MD or APP.  Signed, Elgie Collard, PA-C 11/13/2022, 10:24 AM [name]

## 2022-11-13 ENCOUNTER — Ambulatory Visit: Payer: Federal, State, Local not specified - PPO | Attending: Internal Medicine | Admitting: Physician Assistant

## 2022-11-13 ENCOUNTER — Encounter: Payer: Self-pay | Admitting: Physician Assistant

## 2022-11-13 VITALS — BP 138/80 | HR 72 | Ht 67.0 in | Wt 222.8 lb

## 2022-11-13 DIAGNOSIS — Z79899 Other long term (current) drug therapy: Secondary | ICD-10-CM | POA: Diagnosis not present

## 2022-11-13 DIAGNOSIS — E782 Mixed hyperlipidemia: Secondary | ICD-10-CM | POA: Diagnosis not present

## 2022-11-13 DIAGNOSIS — I480 Paroxysmal atrial fibrillation: Secondary | ICD-10-CM

## 2022-11-13 DIAGNOSIS — I2510 Atherosclerotic heart disease of native coronary artery without angina pectoris: Secondary | ICD-10-CM | POA: Diagnosis not present

## 2022-11-13 MED ORDER — METOPROLOL SUCCINATE ER 25 MG PO TB24: 25.0000 mg | ORAL_TABLET | Freq: Every day | ORAL | 3 refills | Status: DC

## 2022-11-13 NOTE — Patient Instructions (Signed)
Medication Instructions:  Your physician recommends that you continue on your current medications as directed. Please refer to the Current Medication list given to you today.  *If you need a refill on your cardiac medications before your next appointment, please call your pharmacy*   Lab Work: None If you have labs (blood work) drawn today and your tests are completely normal, you will receive your results only by: [place] (if you have MyChart) OR A paper copy in the mail If you have any lab test that is abnormal or we need to change your treatment, we will call you to review the results.   Follow-Up: At [facility], you and your health needs are our priority.  As part of our continuing mission to provide you with exceptional heart care, we have created designated Provider Care Teams.  These Care Teams include your primary Cardiologist (physician) and Advanced Practice Providers (APPs -  Physician Assistants and Nurse Practitioners) who all work together to provide you with the care you need, when you need it.   Your next appointment:   1 year(s)  Provider:   Dorris Carnes, MD

## 2022-11-17 ENCOUNTER — Ambulatory Visit: Payer: Federal, State, Local not specified - PPO | Admitting: Internal Medicine

## 2022-12-15 LAB — LAB REPORT - SCANNED: EGFR: 86

## 2023-02-11 ENCOUNTER — Other Ambulatory Visit: Payer: Self-pay | Admitting: Internal Medicine

## 2023-05-13 ENCOUNTER — Other Ambulatory Visit: Payer: Self-pay

## 2023-05-13 ENCOUNTER — Emergency Department (HOSPITAL_COMMUNITY)
Admission: EM | Admit: 2023-05-13 | Discharge: 2023-05-13 | Disposition: A | Discharge status: Home / Self Care | Payer: Federal, State, Local not specified - PPO | Attending: Emergency Medicine | Admitting: Emergency Medicine

## 2023-05-13 DIAGNOSIS — K625 Hemorrhage of anus and rectum: Secondary | ICD-10-CM | POA: Diagnosis present

## 2023-05-13 DIAGNOSIS — Z7982 Long term (current) use of aspirin: Secondary | ICD-10-CM | POA: Diagnosis not present

## 2023-05-13 LAB — CBC WITH DIFFERENTIAL/PLATELET
Abs Immature Granulocytes: 0.03 10*3/uL (ref 0.00–0.07)
Basophils Absolute: 0 10*3/uL (ref 0.0–0.1)
Basophils Relative: 0 %
Eosinophils Absolute: 0 10*3/uL (ref 0.0–0.5)
Eosinophils Relative: 0 %
HCT: 43.3 % (ref 39.0–52.0)
Hemoglobin: 14.9 g/dL (ref 13.0–17.0)
Immature Granulocytes: 0 %
Lymphocytes Relative: 13 %
Lymphs Abs: 1.2 10*3/uL (ref 0.7–4.0)
MCH: 28 pg (ref 26.0–34.0)
MCHC: 34.4 g/dL (ref 30.0–36.0)
MCV: 81.4 fL (ref 80.0–100.0)
Monocytes Absolute: 0.6 10*3/uL (ref 0.1–1.0)
Monocytes Relative: 6 %
Neutro Abs: 7.6 10*3/uL (ref 1.7–7.7)
Neutrophils Relative %: 81 %
Platelets: 223 10*3/uL (ref 150–400)
RBC: 5.32 MIL/uL (ref 4.22–5.81)
RDW: 12.8 % (ref 11.5–15.5)
WBC: 9.4 10*3/uL (ref 4.0–10.5)
nRBC: 0 % (ref 0.0–0.2)

## 2023-05-13 LAB — COMPREHENSIVE METABOLIC PANEL
ALT: 30 U/L (ref 0–44)
AST: 22 U/L (ref 15–41)
Albumin: 3.7 g/dL (ref 3.5–5.0)
Alkaline Phosphatase: 72 U/L (ref 38–126)
Anion gap: 9 (ref 5–15)
BUN: 12 mg/dL (ref 8–23)
CO2: 23 mmol/L (ref 22–32)
Calcium: 9 mg/dL (ref 8.9–10.3)
Chloride: 105 mmol/L (ref 98–111)
Creatinine, Ser: 1.25 mg/dL — ABNORMAL HIGH (ref 0.61–1.24)
GFR, Estimated: >60 mL/min (ref >60)
Glucose, Bld: 114 mg/dL — ABNORMAL HIGH (ref 70–99)
Potassium: 4.1 mmol/L (ref 3.5–5.1)
Sodium: 137 mmol/L (ref 135–145)
Total Bilirubin: 0.7 mg/dL (ref 0.3–1.2)
Total Protein: 6.6 g/dL (ref 6.5–8.1)

## 2023-05-13 LAB — PROTIME-INR
INR: 0.9 (ref 0.8–1.2)
Prothrombin Time: 12.7 seconds (ref 11.4–15.2)

## 2023-05-13 NOTE — Discharge Instructions (Signed)
You were seen in the emergency department for your rectal bleeding.  You had no further episodes of bleeding in the emergency department and no signs of anemia that you lost too much blood or signs that your blood is not clotting correctly.  This is likely related to your procedure yesterday and you should follow-up with your surgeon to have your hemorrhoids and bleeding rechecked.  You should return to the emergency department if you are having recurrent episodes of bleeding and are passing large blood clots or bleeding in between bowel movements, you have severe abdominal or rectal pain, you become lightheaded or pass out or if you have any other new or concerning symptoms.

## 2023-05-13 NOTE — ED Triage Notes (Signed)
Patient reports having rectal surgery yesterday and then having blood on the toilet paper when he wiped this morning. No frank blood in the toilet. Denies pain.

## 2023-05-13 NOTE — ED Provider Notes (Signed)
Terry Guzman   CSN: [id number] Arrival date & time: 05/13/23  4098     History  Chief Complaint  Patient presents with   Post-op Problem    Terry Guzman is a 62 y.o. male.  Patient is a 62 year old male presenting to the emergency department with rectal bleeding after hemorrhoid band procedure yesterday.  He states that he had his hemorrhoids banded about 1 month ago and had no complications from the procedure.  He states that yesterday he had 2 more bands placed and had significant pain during the procedure and then this morning had blood in his stool.  He states that he had blood on the tissue when he wiped and a small amount of blood mixed in with his stool in the toilet.  He states that he has had no further bleeding since then.  He states that he did call his surgeon on the way to the ER but their office was not yet open and they did call him back while he was in the ER and informed him that a band may have slipped or he may have had some nerve damage from the pain yesterday.  He states he has no ongoing bleeding, no rectal pain or abdominal pain.  He denies lightheadedness or dizziness.  He states that he is on aspirin and no other blood thinners.  The history is provided by the patient and the spouse.       Home Medications Prior to Admission medications   Medication Sig Start Date End Date Taking? Authorizing Provider  aspirin 81 MG tablet Take 81 mg by mouth daily.      [provider]  atorvastatin (LIPITOR) 20 MG tablet Take 20 mg by mouth daily. 04/26/19   [provider]  esomeprazole (NEXIUM) 40 MG capsule Take 40 mg by mouth daily before breakfast.      [provider]  Evolocumab (REPATHA SURECLICK) 140 MG/ML SOAJ Inject 140 mg into the skin every 14 (fourteen) days. 09/23/22   Pricilla Riffle, MD  flecainide (TAMBOCOR) 50 MG tablet TAKE 1 TABLET BY MOUTH TWICE A DAY 02/11/23   Pricilla Riffle, MD  metoprolol succinate (TOPROL-XL) 25 MG 24 hr tablet Take 1 tablet (25 mg total) by mouth daily. 11/13/22   Sharlene Dory, PA-C  Omega-3 Fatty Acids (FISH OIL) 1200 MG CAPS Take by mouth 2 (two) times daily.      [provider]  sildenafil (REVATIO) 20 MG tablet Take 20 mg by mouth as needed. 03/28/19   [provider]  XIFAXAN 550 MG TABS tablet Take 550 mg by mouth 3 (three) times daily. 06/26/21   [provider]  XIIDRA 5 % SOLN Place 1 drop into both eyes as directed. 08/02/20   [provider]      Allergies    Clarithromycin, Ezetimibe, Levofloxacin, Other, and Statins    Review of Systems   Review of Systems  Physical Exam Updated Vital Signs BP 129/74 (BP Location: Right Arm)   Pulse 94   Temp 98.1 F (36.7 C) (Oral)   Resp 19   SpO2 100%  Physical Exam Vitals and nursing Guzman reviewed.  Constitutional:      General: He is not in acute distress.    Appearance: Normal appearance.  HENT:     Head: Normocephalic and atraumatic.     Nose: Nose normal.     Mouth/Throat:  Mouth: Mucous membranes are moist.     Pharynx: Oropharynx is clear.  Eyes:     Extraocular Movements: Extraocular movements intact.     Conjunctiva/sclera: Conjunctivae normal.  Cardiovascular:     Rate and Rhythm: Normal rate and regular rhythm.     Heart sounds: Normal heart sounds.  Pulmonary:     Effort: Pulmonary effort is normal.     Breath sounds: Normal breath sounds.  Abdominal:     General: Abdomen is flat.     Palpations: Abdomen is soft.     Tenderness: There is no abdominal tenderness.  Musculoskeletal:        General: Normal range of motion.     Cervical back: Normal range of motion.  Skin:    General: Skin is warm and dry.  Neurological:     General: No focal deficit present.     Mental Status: He is alert and oriented to person, place, and time.  Psychiatric:        Mood and Affect: Mood normal.        Behavior: Behavior  normal.     ED Results / Procedures / Treatments   Labs (all labs ordered are listed, but only abnormal results are displayed) Labs Reviewed  COMPREHENSIVE METABOLIC PANEL - Abnormal; Notable for the following components:      Result Value   Glucose, Bld 114 (*)    Creatinine, Ser 1.25 (*)    All other components within normal limits  CBC WITH DIFFERENTIAL/PLATELET  PROTIME-INR    EKG None  Radiology No results found.  Procedures Procedures    Medications Ordered in ED Medications - No data to display  ED Course/ Medical Decision Making/ A&P Clinical Course as of 05/13/23 1305  Thu May 13, 2023  1228 Mild AKI, labs otherwise without signs of anemia or coagulopathy. [VK]    Clinical Course User Index [VK] Rexford Maus, DO                                 Medical Decision Making This patient presents to the ED with chief complaint(s) of rectal bleeding with pertinent past medical history of hemorrhoids status post recent banding which further complicates the presenting complaint. The complaint involves an extensive differential diagnosis and also carries with it a high risk of complications and morbidity.    The differential diagnosis includes GI bleed/hemorrhage, coagulopathy, anemia, hemorrhoidal bleeding  Additional history obtained: Additional history obtained from spouse Records reviewed Care Everywhere/External Records - colorectal surgery records  ED Course and Reassessment: Patient's arrival to the emergency department he is hemodynamically stable in no acute distress and has had no further episodes of bleeding.  He will have labs performed to evaluate for anemia or coagulopathy as well as external rectal exam to evaluate for any bleeding or complication.  Independent labs interpretation:  The following labs were independently interpreted: Within normal range  Independent visualization of imaging: -N/A  Consultation: - Consulted or discussed  management/test interpretation w/ external professional: N/A  Consideration for admission or further workup: Patient has no emergent conditions requiring admission or further work-up at this time and is stable for discharge home with primary care follow-up  Social Determinants of health: N/A    Amount and/or Complexity of Data Reviewed Labs: ordered.          Final Clinical Impression(s) / ED Diagnoses Final diagnoses:  Rectal bleeding    Rx /  DC Orders ED Discharge Orders     None         Rexford Maus, [place] 05/13/23 1306

## 2023-08-31 ENCOUNTER — Other Ambulatory Visit (HOSPITAL_COMMUNITY): Payer: Self-pay

## 2023-08-31 ENCOUNTER — Encounter: Payer: Self-pay | Admitting: Physician Assistant

## 2023-08-31 ENCOUNTER — Telehealth: Payer: Self-pay | Admitting: Pharmacy Technician

## 2023-08-31 ENCOUNTER — Encounter: Payer: Self-pay | Admitting: Internal Medicine

## 2023-08-31 DIAGNOSIS — E782 Mixed hyperlipidemia: Secondary | ICD-10-CM

## 2023-08-31 DIAGNOSIS — I2510 Atherosclerotic heart disease of native coronary artery without angina pectoris: Secondary | ICD-10-CM

## 2023-08-31 NOTE — Telephone Encounter (Signed)
Pharmacy Patient Advocate Encounter   Received notification from Patient Advice Request messages that prior authorization for repatha is required/requested.   Insurance verification completed.   The patient is insured through [facility] .   Per test claim: PA required; PA submitted to above mentioned insurance via CoverMyMeds Key/confirmation #/EOC [id number] Status is pending

## 2023-09-01 ENCOUNTER — Other Ambulatory Visit (HOSPITAL_COMMUNITY): Payer: Self-pay

## 2023-09-01 MED ORDER — REPATHA SURECLICK 140 MG/ML ~~LOC~~ SOAJ: 1.0000 | SUBCUTANEOUS | 5 refills | Status: DC

## 2023-09-01 NOTE — Telephone Encounter (Signed)
Pharmacy Patient Advocate Encounter  Received notification from [facility] that Prior Authorization for repatha has been APPROVED from 08/01/23 to 08/30/24. Ran test claim, Copay is $78.58- one month. This test claim was processed through [facility]- copay amounts may vary at other pharmacies due to pharmacy/plan contracts, or as the patient moves through the different stages of their insurance plan.   PA #/Case ID/Reference #: [id number]

## 2023-09-01 NOTE — Addendum Note (Signed)
Addended by: Cheree Ditto on: 09/01/2023 09:42 AM   Modules accepted: Orders

## 2023-09-11 ENCOUNTER — Encounter (HOSPITAL_BASED_OUTPATIENT_CLINIC_OR_DEPARTMENT_OTHER): Payer: Self-pay | Admitting: Urology

## 2023-09-11 ENCOUNTER — Emergency Department (HOSPITAL_BASED_OUTPATIENT_CLINIC_OR_DEPARTMENT_OTHER): Payer: Federal, State, Local not specified - PPO

## 2023-09-11 ENCOUNTER — Inpatient Hospital Stay (HOSPITAL_BASED_OUTPATIENT_CLINIC_OR_DEPARTMENT_OTHER)
Admission: EM | Admit: 2023-09-11 | Discharge: 2023-09-15 | DRG: 872 | Disposition: A | Discharge status: Home / Self Care | Payer: Federal, State, Local not specified - PPO | Attending: Family Medicine | Admitting: Family Medicine

## 2023-09-11 ENCOUNTER — Other Ambulatory Visit: Payer: Self-pay

## 2023-09-11 DIAGNOSIS — Z8249 Family history of ischemic heart disease and other diseases of the circulatory system: Secondary | ICD-10-CM | POA: Diagnosis not present

## 2023-09-11 DIAGNOSIS — Z881 Allergy status to other antibiotic agents status: Secondary | ICD-10-CM

## 2023-09-11 DIAGNOSIS — E876 Hypokalemia: Secondary | ICD-10-CM | POA: Diagnosis present

## 2023-09-11 DIAGNOSIS — Z888 Allergy status to other drugs, medicaments and biological substances status: Secondary | ICD-10-CM | POA: Diagnosis not present

## 2023-09-11 DIAGNOSIS — Z7982 Long term (current) use of aspirin: Secondary | ICD-10-CM

## 2023-09-11 DIAGNOSIS — I480 Paroxysmal atrial fibrillation: Secondary | ICD-10-CM | POA: Diagnosis not present

## 2023-09-11 DIAGNOSIS — N281 Cyst of kidney, acquired: Secondary | ICD-10-CM | POA: Diagnosis not present

## 2023-09-11 DIAGNOSIS — B9789 Other viral agents as the cause of diseases classified elsewhere: Secondary | ICD-10-CM | POA: Diagnosis present

## 2023-09-11 DIAGNOSIS — E66811 Obesity, class 1: Secondary | ICD-10-CM | POA: Diagnosis present

## 2023-09-11 DIAGNOSIS — E871 Hypo-osmolality and hyponatremia: Secondary | ICD-10-CM | POA: Diagnosis present

## 2023-09-11 DIAGNOSIS — I4891 Unspecified atrial fibrillation: Secondary | ICD-10-CM | POA: Diagnosis present

## 2023-09-11 DIAGNOSIS — R197 Diarrhea, unspecified: Secondary | ICD-10-CM | POA: Diagnosis present

## 2023-09-11 DIAGNOSIS — E860 Dehydration: Secondary | ICD-10-CM | POA: Diagnosis not present

## 2023-09-11 DIAGNOSIS — A0472 Enterocolitis due to Clostridium difficile, not specified as recurrent: Secondary | ICD-10-CM | POA: Diagnosis not present

## 2023-09-11 DIAGNOSIS — R109 Unspecified abdominal pain: Principal | ICD-10-CM

## 2023-09-11 DIAGNOSIS — A4189 Other specified sepsis: Secondary | ICD-10-CM | POA: Diagnosis not present

## 2023-09-11 DIAGNOSIS — Z79899 Other long term (current) drug therapy: Secondary | ICD-10-CM | POA: Diagnosis not present

## 2023-09-11 DIAGNOSIS — I10 Essential (primary) hypertension: Secondary | ICD-10-CM | POA: Diagnosis present

## 2023-09-11 DIAGNOSIS — A0811 Acute gastroenteropathy due to Norwalk agent: Secondary | ICD-10-CM | POA: Diagnosis present

## 2023-09-11 DIAGNOSIS — K219 Gastro-esophageal reflux disease without esophagitis: Secondary | ICD-10-CM | POA: Diagnosis present

## 2023-09-11 DIAGNOSIS — I2510 Atherosclerotic heart disease of native coronary artery without angina pectoris: Secondary | ICD-10-CM | POA: Diagnosis present

## 2023-09-11 DIAGNOSIS — E785 Hyperlipidemia, unspecified: Secondary | ICD-10-CM | POA: Diagnosis present

## 2023-09-11 DIAGNOSIS — A419 Sepsis, unspecified organism: Secondary | ICD-10-CM | POA: Diagnosis present

## 2023-09-11 DIAGNOSIS — Z6834 Body mass index (BMI) 34.0-34.9, adult: Secondary | ICD-10-CM | POA: Diagnosis not present

## 2023-09-11 DIAGNOSIS — R6520 Severe sepsis without septic shock: Secondary | ICD-10-CM | POA: Diagnosis not present

## 2023-09-11 LAB — COMPREHENSIVE METABOLIC PANEL
ALT: 45 U/L — ABNORMAL HIGH (ref 0–44)
AST: 36 U/L (ref 15–41)
Albumin: 3.7 g/dL (ref 3.5–5.0)
Alkaline Phosphatase: 66 U/L (ref 38–126)
Anion gap: 12 (ref 5–15)
BUN: 20 mg/dL (ref 8–23)
CO2: 21 mmol/L — ABNORMAL LOW (ref 22–32)
Calcium: 7.9 mg/dL — ABNORMAL LOW (ref 8.9–10.3)
Chloride: 104 mmol/L (ref 98–111)
Creatinine, Ser: 1.18 mg/dL (ref 0.61–1.24)
GFR, Estimated: >60 mL/min (ref >60)
Glucose, Bld: 152 mg/dL — ABNORMAL HIGH (ref 70–99)
Potassium: 4.6 mmol/L (ref 3.5–5.1)
Sodium: 137 mmol/L (ref 135–145)
Total Bilirubin: 1.2 mg/dL — ABNORMAL HIGH (ref <1.2)
Total Protein: 7 g/dL (ref 6.5–8.1)

## 2023-09-11 LAB — TROPONIN I (HIGH SENSITIVITY): Troponin I (High Sensitivity): 3 ng/L (ref <18)

## 2023-09-11 LAB — CBC WITH DIFFERENTIAL/PLATELET
Abs Immature Granulocytes: 0.02 10*3/uL (ref 0.00–0.07)
Basophils Absolute: 0 10*3/uL (ref 0.0–0.1)
Basophils Relative: 0 %
Eosinophils Absolute: 0.2 10*3/uL (ref 0.0–0.5)
Eosinophils Relative: 3 %
HCT: 48.1 % (ref 39.0–52.0)
Hemoglobin: 16.7 g/dL (ref 13.0–17.0)
Immature Granulocytes: 0 %
Lymphocytes Relative: 4 %
Lymphs Abs: 0.3 10*3/uL — ABNORMAL LOW (ref 0.7–4.0)
MCH: 28.8 pg (ref 26.0–34.0)
MCHC: 34.7 g/dL (ref 30.0–36.0)
MCV: 83.1 fL (ref 80.0–100.0)
Monocytes Absolute: 0.2 10*3/uL (ref 0.1–1.0)
Monocytes Relative: 4 %
Neutro Abs: 5.6 10*3/uL (ref 1.7–7.7)
Neutrophils Relative %: 89 %
Platelets: 213 10*3/uL (ref 150–400)
RBC: 5.79 MIL/uL (ref 4.22–5.81)
RDW: 13.3 % (ref 11.5–15.5)
WBC: 6.3 10*3/uL (ref 4.0–10.5)
nRBC: 0 % (ref 0.0–0.2)

## 2023-09-11 LAB — LACTIC ACID, PLASMA
Lactic Acid, Venous: 3.1 mmol/L (ref 0.5–1.9)
Lactic Acid, Venous: 2.9 mmol/L (ref 0.5–1.9)

## 2023-09-11 LAB — LIPASE, BLOOD: Lipase: 26 U/L (ref 11–51)

## 2023-09-11 MED ORDER — ONDANSETRON HCL 4 MG/2ML IJ SOLN
4.0000 mg | Freq: Four times a day (QID) | INTRAMUSCULAR | Status: DC | PRN
  Administered 2023-09-11: 4 mg via INTRAVENOUS
  Filled 2023-09-11: qty 2

## 2023-09-11 MED ORDER — SODIUM CHLORIDE 0.9 % IV BOLUS
1000.0000 mL | Freq: Once | INTRAVENOUS | Status: AC
  Administered 2023-09-11: 1000 mL via INTRAVENOUS

## 2023-09-11 MED ORDER — PIPERACILLIN-TAZOBACTAM 3.375 G IVPB 30 MIN
3.3750 g | Freq: Once | INTRAVENOUS | Status: AC
  Administered 2023-09-11: 3.375 g via INTRAVENOUS
  Filled 2023-09-11: qty 50

## 2023-09-11 MED ORDER — ENOXAPARIN SODIUM 40 MG/0.4ML IJ SOSY
40.0000 mg | PREFILLED_SYRINGE | INTRAMUSCULAR | Status: DC
  Administered 2023-09-12 – 2023-09-15 (×4): 40 mg via SUBCUTANEOUS
  Filled 2023-09-11 (×4): qty 0.4

## 2023-09-11 MED ORDER — IOHEXOL 300 MG/ML  SOLN
100.0000 mL | Freq: Once | INTRAMUSCULAR | Status: AC | PRN
  Administered 2023-09-11: 100 mL via INTRAVENOUS

## 2023-09-11 MED ORDER — LACTATED RINGERS IV SOLN
150.0000 mL/h | INTRAVENOUS | Status: DC
  Administered 2023-09-12: 150 mL/h via INTRAVENOUS

## 2023-09-11 MED ORDER — METRONIDAZOLE 500 MG/100ML IV SOLN
500.0000 mg | Freq: Two times a day (BID) | INTRAVENOUS | Status: DC
  Administered 2023-09-11 – 2023-09-12 (×3): 500 mg via INTRAVENOUS
  Filled 2023-09-11 (×3): qty 100

## 2023-09-11 MED ORDER — ONDANSETRON HCL 4 MG/2ML IJ SOLN
4.0000 mg | Freq: Once | INTRAMUSCULAR | Status: AC
  Administered 2023-09-11: 4 mg via INTRAVENOUS
  Filled 2023-09-11: qty 2

## 2023-09-11 MED ORDER — METOCLOPRAMIDE HCL 5 MG/ML IJ SOLN
10.0000 mg | Freq: Once | INTRAMUSCULAR | Status: AC
  Administered 2023-09-11: 10 mg via INTRAVENOUS
  Filled 2023-09-11: qty 2

## 2023-09-11 MED ORDER — ONDANSETRON HCL 4 MG PO TABS: 4.0000 mg | ORAL_TABLET | Freq: Four times a day (QID) | ORAL | Status: DC | PRN

## 2023-09-11 MED ORDER — SODIUM CHLORIDE 0.9 % IV SOLN
2.0000 g | INTRAVENOUS | Status: DC
  Administered 2023-09-12: 2 g via INTRAVENOUS
  Filled 2023-09-11: qty 20

## 2023-09-11 NOTE — Progress Notes (Signed)
Plan of Care Note for accepted transfer   Patient: Terry Guzman MRN: [id number]   DOA: 09/11/2023  Facility requesting transfer: [facility]   Requesting Provider: Dr. Doran Durand   Reason for transfer: Fever, N/V/D with dehydration   Facility course: 62 yr old man with HLD, CAD, and PAF p/w LLQ pain and severe N/V/D. He is febrile and tachycardic with stable BP. WBC is normal and initial lactate 3.1. CT demonstrates fluid-filled colon.   Blood cultures were collected and he was treated with 2 L NS, Zofran, Reglan, and Zosyn.   Plan of care: The patient is accepted for admission to Progressive unit, at [facility].     Author: Briscoe Deutscher, MD 09/11/2023  Check [email or web address] for on-call coverage.  Nursing staff, Please call [facility] Admits & Consults System-Wide number on Amion as soon as patient's arrival, so appropriate admitting provider can evaluate the pt.

## 2023-09-11 NOTE — H&P (Signed)
History and Physical    Patient: Terry Guzman UJW:[id number] DOB: 05-23-1961 DOA: 09/11/2023 DOS: the patient was seen and examined on 09/11/2023 PCP: April Manson, NP  Patient coming from: Home  Chief Complaint:  Chief Complaint  Patient presents with   Emesis   HPI: Terry Guzman is a 62 y.o. male with medical history significant of atrial fibrillation, coronary artery disease, hyperlipidemia, essential hypertension, GERD among other things who presented to [facility] with nausea vomiting and diarrhea.  Patient also reported left lower quadrant abdominal pain was seen and evaluated.  Patient reported eating some refrigerated food 3 days ago at his work Christmas party.  He also went out to a restaurant with his dad and ate hamburger last night but his dad ate the same thing however he is not sick.  He is not aware of anyone else sick from his Christmas party as well.  Patient had normal white count but elected 0.1.  CT showed no clear-cut colitis but fluid-filled colon.  Placed on IV fluids and Zofran regular and admitted to the hospital for further evaluation.  Patient meets sepsis criteria with a white count normal however the heart rate and fever.  Patient admitted for further evaluation and treatment.  Review of Systems: As mentioned in the history of present illness. All other systems reviewed and are negative. Past Medical History:  Diagnosis Date   Atrial fibrillation (HCC)    on Flecainide   CAD (coronary artery disease)    minimal per cath in November 2011 with mild plaquing.    Dyspnea    Glucose intolerance (impaired glucose tolerance)    High risk medication use    Flecainide therapy   HLD (hyperlipidemia)    HTN (hypertension)    Past Surgical History:  Procedure Laterality Date   CARDIAC CATHETERIZATION     CHOLECYSTECTOMY     Social History:  reports that he has never smoked. He has never used smokeless tobacco. He reports that he does  not drink alcohol and does not use drugs.  Allergies  Allergen Reactions   Clarithromycin    Ezetimibe Diarrhea   Levofloxacin    Other Diarrhea   Statins     Joint pain    Family History  Problem Relation Age of Onset   Heart disease Father    Hypertension Father    Coronary artery disease Unknown     Prior to Admission medications   Medication Sig Start Date End Date Taking? Authorizing Provider  aspirin 81 MG tablet Take 81 mg by mouth daily.      [provider]  atorvastatin (LIPITOR) 20 MG tablet Take 20 mg by mouth daily. 04/26/19   [provider]  esomeprazole (NEXIUM) 40 MG capsule Take 40 mg by mouth daily before breakfast.      [provider]  Evolocumab (REPATHA SURECLICK) 140 MG/ML SOAJ Inject 140 mg into the skin every 14 (fourteen) days. 09/01/23   Pricilla Riffle, MD  flecainide (TAMBOCOR) 50 MG tablet TAKE 1 TABLET BY MOUTH TWICE A DAY 02/11/23   Pricilla Riffle, MD  metoprolol succinate (TOPROL-XL) 25 MG 24 hr tablet Take 1 tablet (25 mg total) by mouth daily. 11/13/22   Sharlene Dory, PA-C  Omega-3 Fatty Acids (FISH OIL) 1200 MG CAPS Take by mouth 2 (two) times daily.      [provider]  sildenafil (REVATIO) 20 MG tablet Take 20 mg by mouth as needed. 03/28/19  [provider]  XIFAXAN 550 MG TABS tablet Take 550 mg by mouth 3 (three) times daily. 06/26/21   [provider]  XIIDRA 5 % SOLN Place 1 drop into both eyes as directed. 08/02/20   [provider]    Physical Exam: Vitals:   09/11/23 1933 09/11/23 2000 09/11/23 2243 09/11/23 2314  BP:   111/71 114/67  Pulse:  (!) 123 (!) 119 (!) 111  Resp:  (!) 24 20 16   Temp: 100 F (37.8 C)  99.9 F (37.7 C) 99.1 F (37.3 C)  TempSrc: Oral  Oral Oral  SpO2:  94% 96% 95%  Weight:      Height:       Constitutional: Acutely ill looking, NAD, calm, comfortable Eyes: PERRL, lids and conjunctivae normal ENMT: Mucous membranes are dry. Posterior pharynx  clear of any exudate or lesions.Normal dentition.  Neck: normal, supple, no masses, no thyromegaly Respiratory: clear to auscultation bilaterally, no wheezing, no crackles. Normal respiratory effort. No accessory muscle use.  Cardiovascular: Sinus tachycardia no murmurs / rubs / gallops. No extremity edema. 2+ pedal pulses. No carotid bruits.  Abdomen: no tenderness, no masses palpated. No hepatosplenomegaly. Bowel sounds positive.  Musculoskeletal: Good range of motion, no joint swelling or tenderness, Skin: no rashes, lesions, ulcers. No induration Neurologic: CN 2-12 grossly intact. Sensation intact, DTR normal. Strength 5/5 in all 4.  Psychiatric: Normal judgment and insight. Alert and oriented x 3. Normal mood  Data Reviewed:  Temperature 100.9, blood pressure 173/82, pulse 125 lactic acid 3.1 white count 6.3 glucose 152.  CT abdomen pelvis shows fluid-filled nondilated colon consistent with incipient diarrhea.  Blood cultures obtained  Assessment and Plan:  #1 sepsis syndrome: Most likely due to GI causes.  Patient will be treated with IV Rocephin and Flagyl.  Supportive care.  IV fluids.  #2 nausea vomiting diarrhea: Acute gastroenteritis suspected.  Will likely be titrated for GI infection.  Probably viral.  Continue to monitor  #3 GERD: Continue PPIs.  #4 coronary artery disease: Stable.  #5 hyperlipidemia: Continue statin    Advance Care Planning:   Code Status: Not on file full code  Consults: None  Family Communication: No family at bedside  Severity of Illness: The appropriate patient status for this patient is INPATIENT. Inpatient status is judged to be reasonable and necessary in order to provide the required intensity of service to ensure the patient's safety. The patient's presenting symptoms, physical exam findings, and initial radiographic and laboratory data in the context of their chronic comorbidities is felt to place them at high risk for further clinical  deterioration. Furthermore, it is not anticipated that the patient will be medically stable for discharge from the hospital within 2 midnights of admission.   * I certify that at the point of admission it is my clinical judgment that the patient will require inpatient hospital care spanning beyond 2 midnights from the point of admission due to high intensity of service, high risk for further deterioration and high frequency of surveillance required.*  AuthorLonia Blood, MD 09/11/2023 11:27 PM  For on call review [email or web address].

## 2023-09-11 NOTE — ED Triage Notes (Signed)
Pt states vomiting and diarrhea since 0200, not tolerating po intake at this time  Unable to take home meds due to nausea  Denies fever

## 2023-09-11 NOTE — ED Notes (Signed)
[facility] called for transport. 

## 2023-09-11 NOTE — Sepsis Progress Note (Signed)
[facility] following code sepsis °

## 2023-09-11 NOTE — ED Provider Notes (Signed)
[place] [facility] Provider Note   CSN: [id number] Arrival date & time: 09/11/23  1618     History Chief Complaint  Patient presents with   Emesis    HPI Terry Guzman is a 62 y.o. male presenting for nausea vomiting diarrhea x 1 day.  Awoke suddenly at 2 AM with severe right lower quadrant abdominal pain radiating to the left lower quadrant became profuse diarrhea throughout the day.  Feels completely unable to keep anything down and immediate vomiting with any p.o. intake.  Apparently has a daughter that had Listeria requiring emergent C-section last week. Otherwise ambulatory tolerating p.o. intake..   Patient's recorded medical, surgical, social, medication list and allergies were reviewed in the Snapshot window as part of the initial history.   Review of Systems   Review of Systems  Constitutional:  Positive for fatigue. Negative for chills and fever.  HENT:  Negative for ear pain and sore throat.   Eyes:  Negative for pain and visual disturbance.  Respiratory:  Negative for cough and shortness of breath.   Cardiovascular:  Negative for chest pain and palpitations.  Gastrointestinal:  Positive for abdominal pain and nausea. Negative for vomiting.  Genitourinary:  Negative for dysuria and hematuria.  Musculoskeletal:  Negative for arthralgias and back pain.  Skin:  Negative for color change and rash.  Neurological:  Positive for weakness. Negative for seizures and syncope.  All other systems reviewed and are negative.   Physical Exam Updated Vital Signs BP 130/81   Pulse (!) 123   Temp 100 F (37.8 C) (Oral)   Resp (!) 24   Ht 5\' 7"  (1.702 m)   Wt 101.1 kg   SpO2 94%   BMI 34.91 kg/m  Physical Exam Vitals and nursing note reviewed.  Constitutional:      General: He is not in acute distress.    Appearance: He is well-developed.  HENT:     Head: Normocephalic and atraumatic.  Eyes:     Conjunctiva/sclera:  Conjunctivae normal.  Cardiovascular:     Rate and Rhythm: Regular rhythm. Tachycardia present.     Heart sounds: No murmur heard. Pulmonary:     Effort: Pulmonary effort is normal. No respiratory distress.     Breath sounds: Normal breath sounds.  Abdominal:     Palpations: Abdomen is soft.     Tenderness: There is abdominal tenderness.  Musculoskeletal:        General: No swelling.     Cervical back: Neck supple.  Skin:    General: Skin is warm and dry.     Capillary Refill: Capillary refill takes less than 2 seconds.  Neurological:     Mental Status: He is alert.  Psychiatric:        Mood and Affect: Mood normal.      ED Course/ Medical Decision Making/ A&P    Procedures .Critical Care  Performed by: Glyn Ade, MD Authorized by: Glyn Ade, MD   Critical care provider statement:    Critical care time (minutes):  90   Critical care was necessary to treat or prevent imminent or life-threatening deterioration of the following conditions:  Dehydration and sepsis   Critical care was time spent personally by me on the following activities:  Development of treatment plan with patient or surrogate, discussions with consultants, evaluation of patient's response to treatment, examination of patient, ordering and review of laboratory studies, ordering and performing treatments and interventions, pulse oximetry, re-evaluation of patient's  condition, review of old charts and ordering and review of radiographic studies   I assumed direction of critical care for this patient from another provider in my specialty: yes     Care discussed with: admitting provider      Medications Ordered in ED Medications  ondansetron (ZOFRAN) injection 4 mg (4 mg Intravenous Given 09/11/23 1639)  sodium chloride 0.9 % bolus 1,000 mL (0 mLs Intravenous Stopped 09/11/23 1744)  piperacillin-tazobactam (ZOSYN) IVPB 3.375 g (0 g Intravenous Stopped 09/11/23 1839)  sodium chloride 0.9 % bolus  1,000 mL (0 mLs Intravenous Stopped 09/11/23 1850)  metoCLOPramide (REGLAN) injection 10 mg (10 mg Intravenous Given 09/11/23 1916)  iohexol (OMNIPAQUE) 300 MG/ML solution 100 mL (100 mLs Intravenous Contrast Given 09/11/23 1941)   Medical Decision Making:   Terry Guzman is a 62 y.o. male who presented to the ED today with abdominal pain, detailed above.    Patient placed on continuous vitals and telemetry monitoring while in ED which was reviewed periodically.  Complete initial physical exam performed, notably the patient  was HDS in NAD.     Reviewed and confirmed nursing documentation for past medical history, family history, social history.    Initial Assessment:   With the patient's presentation of abdominal pain, most likely diagnosis is nonspecific etiology. Other diagnoses were considered including (but not limited to) gastroenteritis, colitis, small bowel obstruction, appendicitis, cholecystitis, pancreatitis, nephrolithiasis, UTI, pyleonephritis, ruptured ectopic pregnancy. These are considered less likely due to history of present illness and physical exam findings.   This is most consistent with an acute life/limb threatening illness complicated by underlying chronic conditions.   Initial Plan:  CBC/CMP to evaluate for underlying infectious/metabolic etiology for patient's abdominal pain  Lipase to evaluate for pancreatitis  EKG to evaluate for cardiac source of pain  CTAB/Pelvis with contrast to evaluate for structural/surgical etiology of patients' severe abdominal pain.  Urinalysis and repeat physical assessment to evaluate for UTI/Pyelonpehritis  Empiric management of symptoms with escalating pain control and antiemetics as needed.   Initial Study Results:   Laboratory  Lactic acidosis   EKG EKG was reviewed independently. Rate, rhythm, axis, intervals all examined and without medically relevant abnormality. ST segments without concerns for elevations.     Radiology All images reviewed independently. Agree with radiology report at this time.   CT ABDOMEN PELVIS W CONTRAST Result Date: 09/11/2023 CLINICAL DATA:  Abdominal pain. EXAM: CT ABDOMEN AND PELVIS WITH CONTRAST TECHNIQUE: Multidetector CT imaging of the abdomen and pelvis was performed using the standard protocol following bolus administration of intravenous contrast. RADIATION DOSE REDUCTION: This exam was performed according to the departmental dose-optimization program which includes automated exposure control, adjustment of the mA and/or kV according to patient size and/or use of iterative reconstruction technique. CONTRAST:  OMNIPAQUE IOHEXOL 300 MG/ML  SOLN COMPARISON:  04/28/2010. FINDINGS: Lower chest: Bases clear.  No pleural or pericardial effusion Hepatobiliary: Status post cholecystectomy. No biliary ductal dilatation. Numerous small bilobar hepatic cysts. Pancreas: Unremarkable. No pancreatic ductal dilatation or surrounding inflammatory changes. Spleen: Normal in size without focal abnormality. Adrenals/Urinary Tract: No adrenal lesions. 8 cm cyst left kidney. Additional small cysts bilaterally. Stomach/Bowel: No bowel dilatation to suggest obstruction. Unremarkable stomach. Small hiatal hernia. Normal appendix. Diffuse fluid-filled nondilated colon consistent with incipient diarrhea. Vascular/Lymphatic: Aortic atherosclerosis. No enlarged abdominal or pelvic lymph nodes. Reproductive: Prostate is unremarkable. Other: No abdominal wall hernia or abnormality. No abdominopelvic ascites. Musculoskeletal: No acute or significant osseous findings. IMPRESSION: 1. Fluid-filled nondilated colon  consistent with incipient diarrhea. 2. Hepatic and renal cysts. 3. Small hiatal hernia. 4. Aortic Atherosclerosis (ICD10-I70.0). Electronically Signed   By: Layla Maw M.D.   On: 09/11/2023 20:01     Consults: Case discussed with Hospitalist.  Reassessment: Slowly improving on  reassessment.  Heart rate downtrending no acute distress arrange for admission for ongoing care and management of severe gastroenteritis.  Disposition:   Based on the above findings, I believe this patient is stable for admission.    Patient/family educated about specific findings on our evaluation and explained exact reasons for admission.  Patient/family educated about clinical situation and time was allowed to answer questions.   Admission team communicated with and agreed with need for admission. Patient admitted. Patient  ready to move at this time.     Emergency Department Medication Summary:   Medications  ondansetron [facility]) injection 4 mg (4 mg Intravenous Given 09/11/23 1639)  sodium chloride 0.9 % bolus 1,000 mL (0 mLs Intravenous Stopped 09/11/23 1744)  piperacillin-tazobactam (ZOSYN) IVPB 3.375 g (0 g Intravenous Stopped 09/11/23 1839)  sodium chloride 0.9 % bolus 1,000 mL (0 mLs Intravenous Stopped 09/11/23 1850)  metoCLOPramide (REGLAN) injection 10 mg (10 mg Intravenous Given 09/11/23 1916)  iohexol (OMNIPAQUE) 300 MG/ML solution 100 mL (100 mLs Intravenous Contrast Given 09/11/23 1941)     Clinical Impression:  1. Abdominal pain, unspecified abdominal location      Admit   Final Clinical Impression(s) / ED Diagnoses Final diagnoses:  Abdominal pain, unspecified abdominal location    Rx / DC Orders ED Discharge Orders     None         Glyn Ade, MD 09/11/23 2110

## 2023-09-11 NOTE — ED Notes (Signed)
Dr. Doran Durand aware of lactic acid 3.1.

## 2023-09-12 DIAGNOSIS — R197 Diarrhea, unspecified: Secondary | ICD-10-CM | POA: Diagnosis not present

## 2023-09-12 LAB — COMPREHENSIVE METABOLIC PANEL
ALT: 72 U/L — ABNORMAL HIGH (ref 0–44)
AST: 59 U/L — ABNORMAL HIGH (ref 15–41)
Albumin: 3.2 g/dL — ABNORMAL LOW (ref 3.5–5.0)
Alkaline Phosphatase: 51 U/L (ref 38–126)
Anion gap: 7 (ref 5–15)
BUN: 16 mg/dL (ref 8–23)
CO2: 21 mmol/L — ABNORMAL LOW (ref 22–32)
Calcium: 7.5 mg/dL — ABNORMAL LOW (ref 8.9–10.3)
Chloride: 104 mmol/L (ref 98–111)
Creatinine, Ser: 0.95 mg/dL (ref 0.61–1.24)
GFR, Estimated: >60 mL/min (ref >60)
Glucose, Bld: 113 mg/dL — ABNORMAL HIGH (ref 70–99)
Potassium: 3 mmol/L — ABNORMAL LOW (ref 3.5–5.1)
Sodium: 132 mmol/L — ABNORMAL LOW (ref 135–145)
Total Bilirubin: 0.9 mg/dL (ref <1.2)
Total Protein: 5.9 g/dL — ABNORMAL LOW (ref 6.5–8.1)

## 2023-09-12 LAB — C DIFFICILE QUICK SCREEN W PCR REFLEX
C Diff antigen: POSITIVE — AB
C Diff toxin: NEGATIVE

## 2023-09-12 LAB — CBC
HCT: 43.6 % (ref 39.0–52.0)
Hemoglobin: 14.6 g/dL (ref 13.0–17.0)
MCH: 29 pg (ref 26.0–34.0)
MCHC: 33.5 g/dL (ref 30.0–36.0)
MCV: 86.5 fL (ref 80.0–100.0)
Platelets: 179 10*3/uL (ref 150–400)
RBC: 5.04 MIL/uL (ref 4.22–5.81)
RDW: 13.4 % (ref 11.5–15.5)
WBC: 4.4 10*3/uL (ref 4.0–10.5)
nRBC: 0 % (ref 0.0–0.2)

## 2023-09-12 LAB — GASTROINTESTINAL PANEL BY PCR, STOOL (REPLACES STOOL CULTURE)

## 2023-09-12 LAB — PROTIME-INR
INR: 1.1 (ref 0.8–1.2)
Prothrombin Time: 13.9 s (ref 11.4–15.2)

## 2023-09-12 LAB — HIV ANTIBODY (ROUTINE TESTING W REFLEX): HIV Screen 4th Generation wRfx: NONREACTIVE

## 2023-09-12 LAB — CORTISOL-AM, BLOOD: Cortisol - AM: 17.9 ug/dL (ref 6.7–22.6)

## 2023-09-12 LAB — CLOSTRIDIUM DIFFICILE BY PCR, REFLEXED: Toxigenic C. Difficile by PCR: POSITIVE — AB

## 2023-09-12 LAB — PROCALCITONIN: Procalcitonin: 0.55 ng/mL

## 2023-09-12 MED ORDER — FLECAINIDE ACETATE 50 MG PO TABS
50.0000 mg | ORAL_TABLET | Freq: Two times a day (BID) | ORAL | Status: DC
  Administered 2023-09-12 – 2023-09-15 (×6): 50 mg via ORAL
  Filled 2023-09-12 (×6): qty 1

## 2023-09-12 MED ORDER — VANCOMYCIN HCL 125 MG PO CAPS
125.0000 mg | ORAL_CAPSULE | Freq: Four times a day (QID) | ORAL | Status: DC
Start: 2023-09-12 — End: 2023-09-22
  Administered 2023-09-12 – 2023-09-15 (×11): 125 mg via ORAL
  Filled 2023-09-12 (×13): qty 1

## 2023-09-12 MED ORDER — METOPROLOL SUCCINATE ER 50 MG PO TB24
25.0000 mg | ORAL_TABLET | Freq: Every day | ORAL | Status: DC
  Administered 2023-09-13 – 2023-09-15 (×3): 25 mg via ORAL
  Filled 2023-09-12 (×3): qty 1

## 2023-09-12 MED ORDER — LACTATED RINGERS IV SOLN: INTRAVENOUS | Status: DC

## 2023-09-12 MED ORDER — ALPRAZOLAM 0.25 MG PO TABS: 0.2500 mg | ORAL_TABLET | Freq: Every day | ORAL | Status: DC | PRN

## 2023-09-12 MED ORDER — POTASSIUM CHLORIDE CRYS ER 20 MEQ PO TBCR
40.0000 meq | EXTENDED_RELEASE_TABLET | ORAL | Status: AC
  Administered 2023-09-12 (×2): 40 meq via ORAL
  Filled 2023-09-12 (×2): qty 2

## 2023-09-12 MED ORDER — PANTOPRAZOLE SODIUM 40 MG PO TBEC
40.0000 mg | DELAYED_RELEASE_TABLET | Freq: Every day | ORAL | Status: DC
  Administered 2023-09-13 – 2023-09-15 (×3): 40 mg via ORAL
  Filled 2023-09-12 (×3): qty 1

## 2023-09-12 MED ORDER — ASPIRIN 81 MG PO TBEC
81.0000 mg | DELAYED_RELEASE_TABLET | Freq: Every day | ORAL | Status: DC
  Administered 2023-09-13 – 2023-09-15 (×3): 81 mg via ORAL
  Filled 2023-09-12 (×3): qty 1

## 2023-09-12 MED ORDER — METOPROLOL SUCCINATE ER 50 MG PO TB24: 25.0000 mg | ORAL_TABLET | Freq: Every day | ORAL | Status: DC

## 2023-09-12 NOTE — Plan of Care (Signed)

## 2023-09-12 NOTE — Progress Notes (Addendum)
PROGRESS NOTE    Terry Guzman  HQI:[id number] DOB: 29-Jun-1961 DOA: 09/11/2023 PCP: April Manson, NP   Brief Narrative: 62 year old with past medical history significant for A-fib, CAD, hyperlipidemia, hypertension, GERD who presents with nausea vomiting and diarrhea.  He reports left lower quadrant abdominal pain.  He ate refrigerated fluids 3 days ago at his work [employer].  CT showed no clear-cut colitis but fluid-filled colon.   Assessment & Plan:   Principal Problem:   Diarrhea with dehydration Active Problems:   Dyslipidemia   CORONARY ATHEROSCLEROSIS NATIVE CORONARY ARTERY   ATRIAL FIBRILLATION  1-Sepsis secondary to possible Gastroenteritis. C diff colitis ?  Nausea, vomiting, Diarrhea Presents with fever 101.9, tachycardia heart rate 125, tachypnea respiration rate 24, lactic acid 3.1.  CT abdomen and pelvis: Fluid-filled nondilated colon consistent with incipient diarrhea.  Hepatic and renal cysts. GI pathogen pending.  -Continue with Flagyl. DC ceftriaxone.  -he report taking course of amoxicillin for 10 days for sinusitis.  -C diff order: antigen Positive, Toxin negative PCR pending. Will start empirically oral Vancomycin. High suspicious for C diff.    GERD: resume PPI   CAD, A fib  Resume metoprolol, start tomorrow.  Resume aspirin.  Resume flecainide.   Lipidemia: on Repatha out patient   Ptosis repair Internal 09/09/2023 By Dr Dairl Ponder.  On antibiotics ointment.   Estimated body mass index is 34.91 kg/m as calculated from the following:   Height as of this encounter: 5\' 7"  (1.702 m).   Weight as of this encounter: 101.1 kg.   DVT prophylaxis: SCD, lovenox Code Status: Full code Family Communication: care discussed with patient.  Disposition Plan:  Status is: Observation The patient remains OBS appropriate and will d/c before 2 midnights.    Consultants:  none  Procedures:  none  Antimicrobials:    Subjective: He  report feeling better, feels abdomen is less distended.  No further vomiting.  Still having multiples BM  Objective: Vitals:   09/11/23 2000 09/11/23 2243 09/11/23 2314 09/12/23 0300  BP:  111/71 114/67 132/72  Pulse: (!) 123 (!) 119 (!) 111 88  Resp: (!) 24 20 16 18   Temp:  99.9 F (37.7 C) 99.1 F (37.3 C) 98.7 F (37.1 C)  TempSrc:  Oral Oral Oral  SpO2: 94% 96% 95% 97%  Weight:      Height:        Intake/Output Summary (Last 24 hours) at 09/12/2023 0815 Last data filed at 09/12/2023 0300 Gross per 24 hour  Intake 3656.07 ml  Output --  Net 3656.07 ml   Filed Weights   09/11/23 1625  Weight: 101.1 kg    Examination:  General exam: Appears calm and comfortable  Respiratory system: Clear to auscultation. Respiratory effort normal. Cardiovascular system: S1 & S2 heard, RRR. No JVD, murmurs, rubs, gallops or clicks. No pedal edema. Gastrointestinal system: Abdomen is nondistended, soft and nontender. No organomegaly or masses felt. Normal bowel sounds heard. Central nervous system: Alert and oriented. No focal neurological deficits. Extremities: Symmetric 5 x 5 power. Skin: No rashes, lesions or ulcers Psychiatry: Judgement and insight appear normal. Mood & affect appropriate.     Data Reviewed: I have personally reviewed following labs and imaging studies  CBC: Recent Labs  Lab 09/11/23 1642 09/12/23 0533  WBC 6.3 4.4  NEUTROABS 5.6  --   HGB 16.7 14.6  HCT 48.1 43.6  MCV 83.1 86.5  PLT 213 179   Basic Metabolic Panel: Recent Labs  Lab 09/11/23  1729  NA 137  K 4.6  CL 104  CO2 21*  GLUCOSE 152*  BUN 20  CREATININE 1.18  CALCIUM 7.9*   GFR: Estimated Creatinine Clearance: 73.5 mL/min (by C-G formula based on SCr of 1.18 mg/dL). Liver Function Tests: Recent Labs  Lab 09/11/23 1729  AST 36  ALT 45*  ALKPHOS 66  BILITOT 1.2*  PROT 7.0  ALBUMIN 3.7   Recent Labs  Lab 09/11/23 1729  LIPASE 26   No results for input(s): "AMMONIA" in  the last 168 hours. Coagulation Profile: Recent Labs  Lab 09/12/23 0533  INR 1.1   Cardiac Enzymes: No results for input(s): "CKTOTAL", "CKMB", "CKMBINDEX", "TROPONINI" in the last 168 hours. BNP (last 3 results) No results for input(s): "PROBNP" in the last 8760 hours. HbA1C: No results for input(s): "HGBA1C" in the last 72 hours. CBG: No results for input(s): "GLUCAP" in the last 168 hours. Lipid Profile: No results for input(s): "CHOL", "HDL", "LDLCALC", "TRIG", "CHOLHDL", "LDLDIRECT" in the last 72 hours. Thyroid Function Tests: No results for input(s): "TSH", "T4TOTAL", "FREET4", "T3FREE", "THYROIDAB" in the last 72 hours. Anemia Panel: No results for input(s): "VITAMINB12", "FOLATE", "FERRITIN", "TIBC", "IRON", "RETICCTPCT" in the last 72 hours. Sepsis Labs: Recent Labs  Lab 09/11/23 1751 09/11/23 1930  LATICACIDVEN 3.1* 2.9*    Recent Results (from the past 240 hours)  Blood culture (routine x 2)     Status: None (Preliminary result)   Collection Time: 09/11/23  6:07 PM   Specimen: BLOOD  Result Value Ref Range Status   Specimen Description   Final    BLOOD RIGHT ANTECUBITAL Performed at [facility], [address] [place], [place] [address]    Special Requests   Final    BOTTLES DRAWN AEROBIC AND ANAEROBIC Blood Culture results may not be optimal due to an inadequate volume of blood received in culture bottles Performed at [facility], [address] [place], [place] [address]    Culture PENDING  Incomplete   Report Status PENDING  Incomplete         Radiology Studies: CT ABDOMEN PELVIS W CONTRAST Result Date: 09/11/2023 CLINICAL DATA:  Abdominal pain. EXAM: CT ABDOMEN AND PELVIS WITH CONTRAST TECHNIQUE: Multidetector CT imaging of the abdomen and pelvis was performed using the standard protocol following bolus administration of intravenous contrast. RADIATION DOSE REDUCTION: This exam was performed according to the departmental  dose-optimization program which includes automated exposure control, adjustment of the mA and/or kV according to patient size and/or use of iterative reconstruction technique. CONTRAST:  OMNIPAQUE IOHEXOL 300 MG/ML  SOLN COMPARISON:  04/28/2010. FINDINGS: Lower chest: Bases clear.  No pleural or pericardial effusion Hepatobiliary: Status post cholecystectomy. No biliary ductal dilatation. Numerous small bilobar hepatic cysts. Pancreas: Unremarkable. No pancreatic ductal dilatation or surrounding inflammatory changes. Spleen: Normal in size without focal abnormality. Adrenals/Urinary Tract: No adrenal lesions. 8 cm cyst left kidney. Additional small cysts bilaterally. Stomach/Bowel: No bowel dilatation to suggest obstruction. Unremarkable stomach. Small hiatal hernia. Normal appendix. Diffuse fluid-filled nondilated colon consistent with incipient diarrhea. Vascular/Lymphatic: Aortic atherosclerosis. No enlarged abdominal or pelvic lymph nodes. Reproductive: Prostate is unremarkable. Other: No abdominal wall hernia or abnormality. No abdominopelvic ascites. Musculoskeletal: No acute or significant osseous findings. IMPRESSION: 1. Fluid-filled nondilated colon consistent with incipient diarrhea. 2. Hepatic and renal cysts. 3. Small hiatal hernia. 4. Aortic Atherosclerosis (ICD10-I70.0). Electronically Signed   By: Layla Maw M.D.   On: 09/11/2023 20:01  Scheduled Meds:  enoxaparin (LOVENOX) injection  40 mg Subcutaneous Q24H   Continuous Infusions:  cefTRIAXone (ROCEPHIN)  IV 2 g (09/12/23 0231)   lactated ringers 150 mL/hr (09/12/23 0054)   metronidazole 500 mg (09/11/23 2346)     LOS: 0 days    Time spent: 35 minutes    Samyra Limb A Reannah Totten, MD [facility]   If 7PM-7AM, please contact night-coverage [email or web address]  09/12/2023, 8:15 AM

## 2023-09-13 ENCOUNTER — Other Ambulatory Visit (HOSPITAL_COMMUNITY): Payer: Self-pay

## 2023-09-13 ENCOUNTER — Telehealth (HOSPITAL_COMMUNITY): Payer: Self-pay

## 2023-09-13 DIAGNOSIS — E785 Hyperlipidemia, unspecified: Secondary | ICD-10-CM | POA: Diagnosis present

## 2023-09-13 DIAGNOSIS — K219 Gastro-esophageal reflux disease without esophagitis: Secondary | ICD-10-CM | POA: Diagnosis present

## 2023-09-13 DIAGNOSIS — E860 Dehydration: Secondary | ICD-10-CM | POA: Diagnosis present

## 2023-09-13 DIAGNOSIS — Z79899 Other long term (current) drug therapy: Secondary | ICD-10-CM | POA: Diagnosis not present

## 2023-09-13 DIAGNOSIS — A0811 Acute gastroenteropathy due to Norwalk agent: Secondary | ICD-10-CM | POA: Diagnosis present

## 2023-09-13 DIAGNOSIS — A0472 Enterocolitis due to Clostridium difficile, not specified as recurrent: Secondary | ICD-10-CM | POA: Diagnosis present

## 2023-09-13 DIAGNOSIS — A4189 Other specified sepsis: Secondary | ICD-10-CM | POA: Diagnosis present

## 2023-09-13 DIAGNOSIS — E66811 Obesity, class 1: Secondary | ICD-10-CM | POA: Diagnosis present

## 2023-09-13 DIAGNOSIS — I480 Paroxysmal atrial fibrillation: Secondary | ICD-10-CM | POA: Diagnosis present

## 2023-09-13 DIAGNOSIS — R6520 Severe sepsis without septic shock: Secondary | ICD-10-CM | POA: Diagnosis present

## 2023-09-13 DIAGNOSIS — A419 Sepsis, unspecified organism: Secondary | ICD-10-CM | POA: Diagnosis present

## 2023-09-13 DIAGNOSIS — R197 Diarrhea, unspecified: Secondary | ICD-10-CM | POA: Diagnosis present

## 2023-09-13 DIAGNOSIS — Z8249 Family history of ischemic heart disease and other diseases of the circulatory system: Secondary | ICD-10-CM | POA: Diagnosis not present

## 2023-09-13 DIAGNOSIS — Z888 Allergy status to other drugs, medicaments and biological substances status: Secondary | ICD-10-CM | POA: Diagnosis not present

## 2023-09-13 DIAGNOSIS — B9789 Other viral agents as the cause of diseases classified elsewhere: Secondary | ICD-10-CM | POA: Diagnosis present

## 2023-09-13 DIAGNOSIS — Z881 Allergy status to other antibiotic agents status: Secondary | ICD-10-CM | POA: Diagnosis not present

## 2023-09-13 DIAGNOSIS — I2510 Atherosclerotic heart disease of native coronary artery without angina pectoris: Secondary | ICD-10-CM | POA: Diagnosis present

## 2023-09-13 DIAGNOSIS — N281 Cyst of kidney, acquired: Secondary | ICD-10-CM | POA: Diagnosis present

## 2023-09-13 DIAGNOSIS — Z6834 Body mass index (BMI) 34.0-34.9, adult: Secondary | ICD-10-CM | POA: Diagnosis not present

## 2023-09-13 DIAGNOSIS — E871 Hypo-osmolality and hyponatremia: Secondary | ICD-10-CM | POA: Diagnosis present

## 2023-09-13 DIAGNOSIS — I10 Essential (primary) hypertension: Secondary | ICD-10-CM | POA: Diagnosis present

## 2023-09-13 DIAGNOSIS — Z7982 Long term (current) use of aspirin: Secondary | ICD-10-CM | POA: Diagnosis not present

## 2023-09-13 DIAGNOSIS — E876 Hypokalemia: Secondary | ICD-10-CM | POA: Diagnosis present

## 2023-09-13 LAB — CBC
HCT: 39.6 % (ref 39.0–52.0)
Hemoglobin: 13.5 g/dL (ref 13.0–17.0)
MCH: 29.3 pg (ref 26.0–34.0)
MCHC: 34.1 g/dL (ref 30.0–36.0)
MCV: 85.9 fL (ref 80.0–100.0)
Platelets: 139 10*3/uL — ABNORMAL LOW (ref 150–400)
RBC: 4.61 MIL/uL (ref 4.22–5.81)
RDW: 13.2 % (ref 11.5–15.5)
WBC: 3.8 10*3/uL — ABNORMAL LOW (ref 4.0–10.5)
nRBC: 0 % (ref 0.0–0.2)

## 2023-09-13 LAB — BASIC METABOLIC PANEL
Anion gap: 4 — ABNORMAL LOW (ref 5–15)
BUN: 12 mg/dL (ref 8–23)
CO2: 26 mmol/L (ref 22–32)
Calcium: 7.8 mg/dL — ABNORMAL LOW (ref 8.9–10.3)
Chloride: 104 mmol/L (ref 98–111)
Creatinine, Ser: 0.95 mg/dL (ref 0.61–1.24)
GFR, Estimated: >60 mL/min (ref >60)
Glucose, Bld: 99 mg/dL (ref 70–99)
Potassium: 4.2 mmol/L (ref 3.5–5.1)
Sodium: 134 mmol/L — ABNORMAL LOW (ref 135–145)

## 2023-09-13 MED ORDER — ERYTHROMYCIN 5 MG/GM OP OINT
1.0000 | TOPICAL_OINTMENT | Freq: Three times a day (TID) | OPHTHALMIC | Status: DC
  Administered 2023-09-13 – 2023-09-15 (×7): 1 via OPHTHALMIC

## 2023-09-13 NOTE — TOC CM/SW Note (Signed)
Transition of Care [facility]) - Inpatient Brief Assessment   Patient Details  Name: Terry Guzman MRN: [id number] Date of Birth: Aug 27, 1961  [facility]) CM/SW Contact:    Howell Rucks, RN Phone Number: 09/13/2023, 12:32 PM   Clinical Narrative: Met with pt at bedside to introduce role of TOC/NCM and review for dc planning. Pt reports he has an established PCP and pharmacy, no current home care services or home DME, reports he resides with his spouse and feels safe returning home, confirmed transportation is available at discharge. TOC Brief Assessment completed. No current TOC needs identified.     Transition of Care Asessment: Insurance and Status: Insurance coverage has been reviewed Patient has primary care physician: Yes Home environment has been reviewed: reides with spouse in private residence Prior level of function:: Independent Prior/Current Home Services: No current home services Social Drivers of Health Review: SDOH reviewed no interventions necessary Readmission risk has been reviewed: Yes Transition of care needs: no transition of care needs at this time

## 2023-09-13 NOTE — Plan of Care (Signed)

## 2023-09-13 NOTE — Plan of Care (Signed)
  Problem: Nutrition: Goal: Adequate nutrition will be maintained Outcome: Progressing   Problem: Education: Goal: Knowledge of General Education information will improve Description: Including pain rating scale, medication(s)/side effects and non-pharmacologic comfort measures Outcome: Adequate for Discharge   Problem: Health Behavior/Discharge Planning: Goal: Ability to manage health-related needs will improve Outcome: Adequate for Discharge   Problem: Clinical Measurements: Goal: Ability to maintain clinical measurements within normal limits will improve Outcome: Adequate for Discharge Goal: Will remain free from infection Outcome: Adequate for Discharge Goal: Diagnostic test results will improve Outcome: Adequate for Discharge Goal: Respiratory complications will improve Outcome: Adequate for Discharge Goal: Cardiovascular complication will be avoided Outcome: Adequate for Discharge   Problem: Activity: Goal: Risk for activity intolerance will decrease Outcome: Adequate for Discharge   Problem: Coping: Goal: Level of anxiety will decrease Outcome: Adequate for Discharge   Problem: Elimination: Goal: Will not experience complications related to bowel motility Outcome: Adequate for Discharge Goal: Will not experience complications related to urinary retention Outcome: Adequate for Discharge   Problem: Pain Management: Goal: General experience of comfort will improve Outcome: Adequate for Discharge   Problem: Safety: Goal: Ability to remain free from injury will improve Outcome: Adequate for Discharge   Problem: Skin Integrity: Goal: Risk for impaired skin integrity will decrease Outcome: Adequate for Discharge   Problem: Fluid Volume: Goal: Hemodynamic stability will improve Outcome: Adequate for Discharge   Problem: Clinical Measurements: Goal: Diagnostic test results will improve Outcome: Adequate for Discharge Goal: Signs and symptoms of infection will  decrease Outcome: Adequate for Discharge   Problem: Respiratory: Goal: Ability to maintain adequate ventilation will improve Outcome: Adequate for Discharge

## 2023-09-13 NOTE — Telephone Encounter (Signed)
Pharmacy Patient Advocate Encounter  Insurance verification completed.    The patient is insured through [employer].     Ran test claim for Vancomycin and the current 30 day co-pay is $7.50.   This test claim was processed through [facility]- copay amounts may vary at other pharmacies due to pharmacy/plan contracts, or as the patient moves through the different stages of their insurance plan.

## 2023-09-13 NOTE — Progress Notes (Addendum)
PROGRESS NOTE    Terry Guzman  ZOX:[id number] DOB: 08-12-1961 DOA: 09/11/2023 PCP: April Manson, NP   Brief Narrative:  62 year old with past medical history significant for A-fib, CAD, hyperlipidemia, hypertension, GERD who presents with nausea vomiting and diarrhea. He reports left lower quadrant abdominal pain. He ate refrigerated fluids 3 days ago at his work [employer]. CT showed no clear-cut colitis but fluid-filled colon.   Assessment & Plan:   Principal Problem:   Diarrhea with dehydration Active Problems:   Dyslipidemia   CORONARY ATHEROSCLEROSIS NATIVE CORONARY ARTERY   ATRIAL FIBRILLATION  Sepsis secondary to viral gastroenteritis and C diff colitis, POA: Presents with fever 101.9, tachycardia heart rate 125, tachypnea respiration rate 24, lactic acid 3.1.  CT abdomen and pelvis: Fluid-filled nondilated colon consistent with incipient diarrhea.  Hepatic and renal cysts.  GI pathogen panel positive for norovirus.  C. difficile antigen positive, toxin negative but PCR positive.  Consistent with active C. difficile infection.  Patient started on oral vancomycin and IV Flagyl, continue vancomycin but discontinue Flagyl.  Still has had at least 10-15 bowel movements in last 24's, although 4 are charted only.  Will make sure he is adequately hydrated.   Elevated LFTs: Hold hepatotoxic medications and repeat labs in the morning.  Hypokalemia: Resolved.  GERD: Continue PPI   CAD, A fib  Continue metoprolol, flecainide and aspirin.  Not on any anticoagulation.   Hyperlipidemia: on Repatha out patient but also on atorvastatin which I will hold due to elevated LFTs.   Ptosis repair Internal 09/09/2023 By Dr Dairl Ponder.  On antibiotics ointment.   DVT prophylaxis: enoxaparin (LOVENOX) injection 40 mg Start: 09/12/23 1000   Code Status: Full Code  Family Communication:  None present at bedside.  Plan of care discussed with patient in length and he/she  verbalized understanding and agreed with it.  Status is: Observation The patient will require care spanning > 2 midnights and should be moved to inpatient because: Needs to remain in the hospital for close monitoring and prevention of dehydration.   Estimated body mass index is 34.91 kg/m as calculated from the following:   Height as of this encounter: 5\' 7"  (1.702 m).   Weight as of this encounter: 101.1 kg.    Nutritional Assessment: Body mass index is 34.91 kg/m.Marland Kitchen Seen by dietician.  I agree with the assessment and plan as outlined below: Nutrition Status:        . Skin Assessment: I have examined the patient's skin and I agree with the wound assessment as performed by the wound care RN as outlined below:    Consultants:  None  Procedures:  None  Antimicrobials:  Anti-infectives (From admission, onward)    Start     Dose/Rate Route Frequency Ordered Stop   09/12/23 1800  vancomycin (VANCOCIN) capsule 125 mg        125 mg Oral 4 times daily 09/12/23 1503 09/22/23 1759   09/12/23 0200  cefTRIAXone (ROCEPHIN) 2 g in sodium chloride 0.9 % 100 mL IVPB  Status:  Discontinued        2 g 200 mL/hr over 30 Minutes Intravenous Every 24 hours 09/11/23 2327 09/12/23 0942   09/12/23 0015  metroNIDAZOLE (FLAGYL) IVPB 500 mg        500 mg 100 mL/hr over 60 Minutes Intravenous Every 12 hours 09/11/23 2327 09/19/23 0014   09/11/23 1745  piperacillin-tazobactam (ZOSYN) IVPB 3.375 g        3.375 g 100 mL/hr over  30 Minutes Intravenous  Once 09/11/23 1737 09/11/23 1839         Subjective: Patient seen and examined.  Still complains of significant diarrhea, going to the bathroom every 1-2 hours, intermittent abdominal cramping but no nausea or vomiting.  Tolerating diet.  Objective: Vitals:   09/12/23 1054 09/12/23 1405 09/12/23 2138 09/13/23 0601  BP: 121/68 119/69 125/71 125/66  Pulse: 86 93 87 86  Resp: 18 18 16    Temp: 98.1 F (36.7 C) 98.8 F (37.1 C) 98.4 F (36.9  C) 98.6 F (37 C)  TempSrc: Oral Oral Oral Oral  SpO2: 98% 96% 97% 97%  Weight:      Height:        Intake/Output Summary (Last 24 hours) at 09/13/2023 0746 Last data filed at 09/13/2023 0641 Gross per 24 hour  Intake 3215.09 ml  Output --  Net 3215.09 ml   Filed Weights   09/11/23 1625  Weight: 101.1 kg    Examination:  General exam: Appears calm and comfortable  Respiratory system: Clear to auscultation. Respiratory effort normal. Cardiovascular system: S1 & S2 heard, RRR. No JVD, murmurs, rubs, gallops or clicks. No pedal edema. Gastrointestinal system: Abdomen is slightly distended, soft and nontender. No organomegaly or masses felt. Normal bowel sounds heard. Central nervous system: Alert and oriented. No focal neurological deficits. Extremities: Symmetric 5 x 5 power. Skin: No rashes, lesions or ulcers Psychiatry: Judgement and insight appear normal. Mood & affect appropriate.    Data Reviewed: I have personally reviewed following labs and imaging studies  CBC: Recent Labs  Lab 09/11/23 1642 09/12/23 0533 09/13/23 0510  WBC 6.3 4.4 3.8*  NEUTROABS 5.6  --   --   HGB 16.7 14.6 13.5  HCT 48.1 43.6 39.6  MCV 83.1 86.5 85.9  PLT 213 179 139*   Basic Metabolic Panel: Recent Labs  Lab 09/11/23 1729 09/12/23 1448 09/13/23 0510  NA 137 132* 134*  K 4.6 3.0* 4.2  CL 104 104 104  CO2 21* 21* 26  GLUCOSE 152* 113* 99  BUN 20 16 12   CREATININE 1.18 0.95 0.95  CALCIUM 7.9* 7.5* 7.8*   GFR: Estimated Creatinine Clearance: 91.3 mL/min (by C-G formula based on SCr of 0.95 mg/dL). Liver Function Tests: Recent Labs  Lab 09/11/23 1729 09/12/23 1448  AST 36 59*  ALT 45* 72*  ALKPHOS 66 51  BILITOT 1.2* 0.9  PROT 7.0 5.9*  ALBUMIN 3.7 3.2*   Recent Labs  Lab 09/11/23 1729  LIPASE 26   No results for input(s): "AMMONIA" in the last 168 hours. Coagulation Profile: Recent Labs  Lab 09/12/23 0533  INR 1.1   Cardiac Enzymes: No results for  input(s): "CKTOTAL", "CKMB", "CKMBINDEX", "TROPONINI" in the last 168 hours. BNP (last 3 results) No results for input(s): "PROBNP" in the last 8760 hours. HbA1C: No results for input(s): "HGBA1C" in the last 72 hours. CBG: No results for input(s): "GLUCAP" in the last 168 hours. Lipid Profile: No results for input(s): "CHOL", "HDL", "LDLCALC", "TRIG", "CHOLHDL", "LDLDIRECT" in the last 72 hours. Thyroid Function Tests: No results for input(s): "TSH", "T4TOTAL", "FREET4", "T3FREE", "THYROIDAB" in the last 72 hours. Anemia Panel: No results for input(s): "VITAMINB12", "FOLATE", "FERRITIN", "TIBC", "IRON", "RETICCTPCT" in the last 72 hours. Sepsis Labs: Recent Labs  Lab 09/11/23 1751 09/11/23 1930 09/12/23 0533  PROCALCITON  --   --  0.55  LATICACIDVEN 3.1* 2.9*  --     Recent Results (from the past 240 hours)  Blood culture (routine x  2)     Status: None (Preliminary result)   Collection Time: 09/11/23  5:58 PM   Specimen: BLOOD RIGHT HAND  Result Value Ref Range Status   Specimen Description   Final    BLOOD RIGHT HAND Performed at [facility], [address] [place], [place] [address]    Special Requests   Final    BOTTLES DRAWN AEROBIC ONLY Blood Culture results may not be optimal due to an inadequate volume of blood received in culture bottles Performed at [facility], [address] [place], [place] [address]    Culture   Final    NO GROWTH < 12 HOURS Performed at [facility], [address], [place], [place] [address]    Report Status PENDING  Incomplete  Blood culture (routine x 2)     Status: None (Preliminary result)   Collection Time: 09/11/23  6:07 PM   Specimen: BLOOD  Result Value Ref Range Status   Specimen Description   Final    BLOOD RIGHT ANTECUBITAL Performed at [facility], [address], [place], [place] [address]    Special Requests   Final    BOTTLES DRAWN AEROBIC AND ANAEROBIC Blood Culture results may  not be optimal due to an inadequate volume of blood received in culture bottles Performed at [facility], [address] [place], [place] [address]    Culture   Final    NO GROWTH < 12 HOURS Performed at [facility], [address], [place], [place] [address]    Report Status PENDING  Incomplete  Gastrointestinal Panel by PCR , Stool     Status: Abnormal   Collection Time: 09/12/23  1:32 AM   Specimen: Stool  Result Value Ref Range Status   Campylobacter species NOT DETECTED NOT DETECTED Final   Plesimonas shigelloides NOT DETECTED NOT DETECTED Final   Salmonella species NOT DETECTED NOT DETECTED Final   Yersinia enterocolitica NOT DETECTED NOT DETECTED Final   Vibrio species NOT DETECTED NOT DETECTED Final   Vibrio cholerae NOT DETECTED NOT DETECTED Final   Enteroaggregative E coli (EAEC) NOT DETECTED NOT DETECTED Final   Enteropathogenic E coli (EPEC) NOT DETECTED NOT DETECTED Final   Enterotoxigenic E coli (ETEC) NOT DETECTED NOT DETECTED Final   Shiga like toxin producing E coli (STEC) NOT DETECTED NOT DETECTED Final   Shigella/Enteroinvasive E coli (EIEC) NOT DETECTED NOT DETECTED Final   Cryptosporidium NOT DETECTED NOT DETECTED Final   Cyclospora cayetanensis NOT DETECTED NOT DETECTED Final   Entamoeba histolytica NOT DETECTED NOT DETECTED Final   Giardia lamblia NOT DETECTED NOT DETECTED Final   Adenovirus F40/41 NOT DETECTED NOT DETECTED Final   Astrovirus NOT DETECTED NOT DETECTED Final   Norovirus GI/GII DETECTED (A) NOT DETECTED Final    Comment: RESULT CALLED TO, READ BACK BY AND VERIFIED WITH: KAITLYN SEY @1450  09/12/23 MJU    Rotavirus A NOT DETECTED NOT DETECTED Final   Sapovirus (I, II, IV, and V) NOT DETECTED NOT DETECTED Final    Comment: Performed at [facility], [address] [place], [place] [address]  C Difficile Quick Screen w PCR reflex     Status: Abnormal   Collection Time: 09/12/23 11:36 AM   Specimen: STOOL  Result  Value Ref Range Status   C Diff antigen POSITIVE (A) NEGATIVE Final   C Diff toxin NEGATIVE NEGATIVE Final   C Diff interpretation Results are indeterminate.  See PCR results.  Final    Comment: Performed at [facility], [address] [place], [place] [address]  C. Diff by PCR, Reflexed     Status: Abnormal   Collection Time: 09/12/23 11:36 AM  Result Value Ref Range Status   Toxigenic C. Difficile by PCR POSITIVE (A) NEGATIVE Final    Comment: Positive for toxigenic C. difficile with little to no toxin production. Only treat if clinical presentation suggests symptomatic illness. Performed at [facility], [address], [place], [place] [address]      Radiology Studies: CT ABDOMEN PELVIS W CONTRAST Result Date: 09/11/2023 CLINICAL DATA:  Abdominal pain. EXAM: CT ABDOMEN AND PELVIS WITH CONTRAST TECHNIQUE: Multidetector CT imaging of the abdomen and pelvis was performed using the standard protocol following bolus administration of intravenous contrast. RADIATION DOSE REDUCTION: This exam was performed according to the departmental dose-optimization program which includes automated exposure control, adjustment of the mA and/or kV according to patient size and/or use of iterative reconstruction technique. CONTRAST:  OMNIPAQUE IOHEXOL 300 MG/ML  SOLN COMPARISON:  04/28/2010. FINDINGS: Lower chest: Bases clear.  No pleural or pericardial effusion Hepatobiliary: Status post cholecystectomy. No biliary ductal dilatation. Numerous small bilobar hepatic cysts. Pancreas: Unremarkable. No pancreatic ductal dilatation or surrounding inflammatory changes. Spleen: Normal in size without focal abnormality. Adrenals/Urinary Tract: No adrenal lesions. 8 cm cyst left kidney. Additional small cysts bilaterally. Stomach/Bowel: No bowel dilatation to suggest obstruction. Unremarkable stomach. Small hiatal hernia. Normal appendix. Diffuse fluid-filled nondilated colon consistent with  incipient diarrhea. Vascular/Lymphatic: Aortic atherosclerosis. No enlarged abdominal or pelvic lymph nodes. Reproductive: Prostate is unremarkable. Other: No abdominal wall hernia or abnormality. No abdominopelvic ascites. Musculoskeletal: No acute or significant osseous findings. IMPRESSION: 1. Fluid-filled nondilated colon consistent with incipient diarrhea. 2. Hepatic and renal cysts. 3. Small hiatal hernia. 4. Aortic Atherosclerosis (ICD10-I70.0). Electronically Signed   By: Layla Maw M.D.   On: 09/11/2023 20:01    Scheduled Meds:  aspirin EC  81 mg Oral Daily   enoxaparin (LOVENOX) injection  40 mg Subcutaneous Q24H   flecainide  50 mg Oral BID   metoprolol succinate  25 mg Oral Daily   pantoprazole  40 mg Oral Daily   vancomycin  125 mg Oral QID   Continuous Infusions:  lactated ringers 100 mL/hr at 09/13/23 0641   metronidazole Stopped (09/13/23 0059)     LOS: 0 days   Hughie Closs, MD [facility]  09/13/2023, 7:46 AM   *Please note that this is a verbal dictation therefore any spelling or grammatical errors are due to the "Dragon Medical One" system interpretation.  Please page via Amion and do not message via secure chat for urgent patient care matters. Secure chat can be used for non urgent patient care matters.  How to contact the [facility] Attending or Consulting provider 7A - 7P or covering provider during after hours 7P -7A, for this patient?  Check the care team in [facility] and look for a) attending/consulting [facility] provider listed and b) the [facility] team listed. Page or secure chat 7A-7P. Log into [email or web address] and use [facility]'s universal password to access. If you do not have the password, please contact the hospital operator. Locate the [facility] provider you are looking for under [facility] and page to a number that you can be directly reached. If you still have difficulty reaching the provider, please page the [facility] (Director on Call) for the Hospitalists listed  on amion for assistance.

## 2023-09-14 DIAGNOSIS — R197 Diarrhea, unspecified: Secondary | ICD-10-CM | POA: Diagnosis not present

## 2023-09-14 LAB — BASIC METABOLIC PANEL
Anion gap: 8 (ref 5–15)
BUN: 9 mg/dL (ref 8–23)
CO2: 23 mmol/L (ref 22–32)
Calcium: 8.3 mg/dL — ABNORMAL LOW (ref 8.9–10.3)
Chloride: 103 mmol/L (ref 98–111)
Creatinine, Ser: 0.83 mg/dL (ref 0.61–1.24)
GFR, Estimated: >60 mL/min (ref >60)
Glucose, Bld: 94 mg/dL (ref 70–99)
Potassium: 4 mmol/L (ref 3.5–5.1)
Sodium: 134 mmol/L — ABNORMAL LOW (ref 135–145)

## 2023-09-14 LAB — CBC WITH DIFFERENTIAL/PLATELET
Abs Immature Granulocytes: 0 10*3/uL (ref 0.00–0.07)
Basophils Absolute: 0 10*3/uL (ref 0.0–0.1)
Basophils Relative: 0 %
Eosinophils Absolute: 0 10*3/uL (ref 0.0–0.5)
Eosinophils Relative: 0 %
HCT: 40.3 % (ref 39.0–52.0)
Hemoglobin: 13.8 g/dL (ref 13.0–17.0)
Immature Granulocytes: 0 %
Lymphocytes Relative: 22 %
Lymphs Abs: 0.9 10*3/uL (ref 0.7–4.0)
MCH: 29.1 pg (ref 26.0–34.0)
MCHC: 34.2 g/dL (ref 30.0–36.0)
MCV: 85 fL (ref 80.0–100.0)
Monocytes Absolute: 0.3 10*3/uL (ref 0.1–1.0)
Monocytes Relative: 8 %
Neutro Abs: 2.7 10*3/uL (ref 1.7–7.7)
Neutrophils Relative %: 70 %
Platelets: 164 10*3/uL (ref 150–400)
RBC: 4.74 MIL/uL (ref 4.22–5.81)
RDW: 13.1 % (ref 11.5–15.5)
WBC: 3.9 10*3/uL — ABNORMAL LOW (ref 4.0–10.5)
nRBC: 0 % (ref 0.0–0.2)

## 2023-09-14 NOTE — Plan of Care (Signed)
  Problem: Clinical Measurements: Goal: Will remain free from infection Outcome: Progressing Goal: Diagnostic test results will improve Outcome: Progressing   Problem: Nutrition: Goal: Adequate nutrition will be maintained Outcome: Progressing   Problem: Elimination: Goal: Will not experience complications related to bowel motility Outcome: Progressing   Problem: Fluid Volume: Goal: Hemodynamic stability will improve Outcome: Progressing   Problem: Clinical Measurements: Goal: Signs and symptoms of infection will decrease Outcome: Progressing   Problem: Education: Goal: Knowledge of General Education information will improve Description: Including pain rating scale, medication(s)/side effects and non-pharmacologic comfort measures Outcome: Adequate for Discharge   Problem: Health Behavior/Discharge Planning: Goal: Ability to manage health-related needs will improve Outcome: Adequate for Discharge   Problem: Clinical Measurements: Goal: Ability to maintain clinical measurements within normal limits will improve Outcome: Adequate for Discharge Goal: Respiratory complications will improve Outcome: Adequate for Discharge Goal: Cardiovascular complication will be avoided Outcome: Adequate for Discharge   Problem: Activity: Goal: Risk for activity intolerance will decrease Outcome: Adequate for Discharge   Problem: Coping: Goal: Level of anxiety will decrease Outcome: Adequate for Discharge   Problem: Elimination: Goal: Will not experience complications related to urinary retention Outcome: Adequate for Discharge   Problem: Pain Management: Goal: General experience of comfort will improve Outcome: Adequate for Discharge   Problem: Safety: Goal: Ability to remain free from injury will improve Outcome: Adequate for Discharge   Problem: Skin Integrity: Goal: Risk for impaired skin integrity will decrease Outcome: Adequate for Discharge   Problem: Clinical  Measurements: Goal: Diagnostic test results will improve Outcome: Adequate for Discharge   Problem: Respiratory: Goal: Ability to maintain adequate ventilation will improve Outcome: Adequate for Discharge

## 2023-09-14 NOTE — Progress Notes (Signed)
PROGRESS NOTE    Terry Guzman  ZOX:[id number] DOB: May 06, 1961 DOA: 09/11/2023 PCP: April Manson, NP   Brief Narrative:  62 year old with past medical history significant for A-fib, CAD, hyperlipidemia, hypertension, GERD who presents with nausea vomiting and diarrhea. He reports left lower quadrant abdominal pain. He ate refrigerated fluids 3 days ago at his work [employer]. CT showed no clear-cut colitis but fluid-filled colon.   Assessment & Plan:   Principal Problem:   Diarrhea with dehydration Active Problems:   Dyslipidemia   CORONARY ATHEROSCLEROSIS NATIVE CORONARY ARTERY   ATRIAL FIBRILLATION   Sepsis (HCC)  Sepsis secondary to viral gastroenteritis and C diff colitis, POA: Presents with fever 101.9, tachycardia heart rate 125, tachypnea respiration rate 24, lactic acid 3.1.  CT abdomen and pelvis: Fluid-filled nondilated colon consistent with incipient diarrhea.  Hepatic and renal cysts.  GI pathogen panel positive for norovirus.  C. difficile antigen positive, toxin negative but PCR positive.  Consistent with active C. difficile infection.  He says that he has not had 1 bowel movement in last 8 hours but in aggregate, he has had 12 bowel movements in last 24 hours.  Will continue vancomycin and keep in the hospital until bowel movements are controlled.  Elevated LFTs: Hold hepatotoxic medications and repeat labs in the morning.  Hypokalemia: Resolved.  GERD: Continue PPI   CAD, A fib  Continue metoprolol, flecainide and aspirin.  Not on any anticoagulation.   Hyperlipidemia: on Repatha out patient but also on atorvastatin which I will hold due to elevated LFTs.   Ptosis repair Internal 09/09/2023 By Dr Dairl Ponder.  On antibiotics ointment.   Mild hyponatremia: Stable at 134.  Monitor daily.  DVT prophylaxis: enoxaparin (LOVENOX) injection 40 mg Start: 09/12/23 1000   Code Status: Full Code  Family Communication:  None present at bedside.  Plan of  care discussed with patient in length and he/she verbalized understanding and agreed with it.  Status is: Inpatient Remains inpatient appropriate because: Needs to remain in the hospital due to risk of dehydration with excessive diarrhea.     Estimated body mass index is 34.91 kg/m as calculated from the following:   Height as of this encounter: 5\' 7"  (1.702 m).   Weight as of this encounter: 101.1 kg.    Nutritional Assessment: Body mass index is 34.91 kg/m.Marland Kitchen Seen by dietician.  I agree with the assessment and plan as outlined below: Nutrition Status:        . Skin Assessment: I have examined the patient's skin and I agree with the wound assessment as performed by the wound care RN as outlined below:    Consultants:  None  Procedures:  None  Antimicrobials:  Anti-infectives (From admission, onward)    Start     Dose/Rate Route Frequency Ordered Stop   09/12/23 1800  vancomycin (VANCOCIN) capsule 125 mg        125 mg Oral 4 times daily 09/12/23 1503 09/22/23 1759   09/12/23 0200  cefTRIAXone (ROCEPHIN) 2 g in sodium chloride 0.9 % 100 mL IVPB  Status:  Discontinued        2 g 200 mL/hr over 30 Minutes Intravenous Every 24 hours 09/11/23 2327 09/12/23 0942   09/12/23 0015  metroNIDAZOLE (FLAGYL) IVPB 500 mg  Status:  Discontinued        500 mg 100 mL/hr over 60 Minutes Intravenous Every 12 hours 09/11/23 2327 09/13/23 0918   09/11/23 1745  piperacillin-tazobactam (ZOSYN) IVPB 3.375 g  3.375 g 100 mL/hr over 30 Minutes Intravenous  Once 09/11/23 1737 09/11/23 1839         Subjective: Patient seen and examined.  He says that he feels better than yesterday, less bloated and less bowel movements.  He appeared to have conjunctival injection bilaterally today, I did not notice that yesterday.  He tells me that he developed that 2 to 3 days ago when he had excessive vomiting.  Currently he has no nausea, he is tolerating diet.  Objective: Vitals:   09/13/23  0601 09/13/23 1239 09/13/23 2042 09/14/23 0527  BP: 125/66 117/65 120/71 129/68  Pulse: 86 77 68 76  Resp:  20 18 20   Temp: 98.6 F (37 C) 98.5 F (36.9 C) 98.5 F (36.9 C) 98.4 F (36.9 C)  TempSrc: Oral Oral Oral Oral  SpO2: 97% 96% 97% 97%  Weight:      Height:        Intake/Output Summary (Last 24 hours) at 09/14/2023 1122 Last data filed at 09/14/2023 0600 Gross per 24 hour  Intake 2760 ml  Output --  Net 2760 ml   Filed Weights   09/11/23 1625  Weight: 101.1 kg    Examination:  General exam: Appears calm and comfortable  Respiratory system: Clear to auscultation. Respiratory effort normal. Cardiovascular system: S1 & S2 heard, RRR. No JVD, murmurs, rubs, gallops or clicks. No pedal edema. Gastrointestinal system: Abdomen is slightly distended, soft and nontender. No organomegaly or masses felt. Normal bowel sounds heard. Central nervous system: Alert and oriented. No focal neurological deficits. Extremities: Symmetric 5 x 5 power. Skin: No rashes, lesions or ulcers.  Psychiatry: Judgement and insight appear normal. Mood & affect appropriate.    Data Reviewed: I have personally reviewed following labs and imaging studies  CBC: Recent Labs  Lab 09/11/23 1642 09/12/23 0533 09/13/23 0510 09/14/23 0902  WBC 6.3 4.4 3.8* 3.9*  NEUTROABS 5.6  --   --  2.7  HGB 16.7 14.6 13.5 13.8  HCT 48.1 43.6 39.6 40.3  MCV 83.1 86.5 85.9 85.0  PLT 213 179 139* 164   Basic Metabolic Panel: Recent Labs  Lab 09/11/23 1729 09/12/23 1448 09/13/23 0510 09/14/23 0902  NA 137 132* 134* 134*  K 4.6 3.0* 4.2 4.0  CL 104 104 104 103  CO2 21* 21* 26 23  GLUCOSE 152* 113* 99 94  BUN 20 16 12 9   CREATININE 1.18 0.95 0.95 0.83  CALCIUM 7.9* 7.5* 7.8* 8.3*   GFR: Estimated Creatinine Clearance: 104.5 mL/min (by C-G formula based on SCr of 0.83 mg/dL). Liver Function Tests: Recent Labs  Lab 09/11/23 1729 09/12/23 1448  AST 36 59*  ALT 45* 72*  ALKPHOS 66 51  BILITOT  1.2* 0.9  PROT 7.0 5.9*  ALBUMIN 3.7 3.2*   Recent Labs  Lab 09/11/23 1729  LIPASE 26   No results for input(s): "AMMONIA" in the last 168 hours. Coagulation Profile: Recent Labs  Lab 09/12/23 0533  INR 1.1   Cardiac Enzymes: No results for input(s): "CKTOTAL", "CKMB", "CKMBINDEX", "TROPONINI" in the last 168 hours. BNP (last 3 results) No results for input(s): "PROBNP" in the last 8760 hours. HbA1C: No results for input(s): "HGBA1C" in the last 72 hours. CBG: No results for input(s): "GLUCAP" in the last 168 hours. Lipid Profile: No results for input(s): "CHOL", "HDL", "LDLCALC", "TRIG", "CHOLHDL", "LDLDIRECT" in the last 72 hours. Thyroid Function Tests: No results for input(s): "TSH", "T4TOTAL", "FREET4", "T3FREE", "THYROIDAB" in the last 72 hours. Anemia Panel:  No results for input(s): "VITAMINB12", "FOLATE", "FERRITIN", "TIBC", "IRON", "RETICCTPCT" in the last 72 hours. Sepsis Labs: Recent Labs  Lab 09/11/23 1751 09/11/23 1930 09/12/23 0533  PROCALCITON  --   --  0.55  LATICACIDVEN 3.1* 2.9*  --     Recent Results (from the past 240 hours)  Blood culture (routine x 2)     Status: None (Preliminary result)   Collection Time: 09/11/23  5:58 PM   Specimen: BLOOD RIGHT HAND  Result Value Ref Range Status   Specimen Description   Final    BLOOD RIGHT HAND Performed at [facility], [address] [place], [place] [address]    Special Requests   Final    BOTTLES DRAWN AEROBIC ONLY Blood Culture results may not be optimal due to an inadequate volume of blood received in culture bottles Performed at [facility], [address] [place], [place] [address]    Culture   Final    NO GROWTH 3 DAYS Performed at [facility], [address] [place], [place] [address]    Report Status PENDING  Incomplete  Blood culture (routine x 2)     Status: None (Preliminary result)   Collection Time: 09/11/23  6:07 PM   Specimen: BLOOD  Result  Value Ref Range Status   Specimen Description   Final    BLOOD RIGHT ANTECUBITAL Performed at [facility], [address] [place], [place] [address]    Special Requests   Final    BOTTLES DRAWN AEROBIC AND ANAEROBIC Blood Culture results may not be optimal due to an inadequate volume of blood received in culture bottles Performed at [facility], [address] [place], [place] [address]    Culture   Final    NO GROWTH 3 DAYS Performed at [facility], [address] [place], [place] [address]    Report Status PENDING  Incomplete  Gastrointestinal Panel by PCR , Stool     Status: Abnormal   Collection Time: 09/12/23  1:32 AM   Specimen: Stool  Result Value Ref Range Status   Campylobacter species NOT DETECTED NOT DETECTED Final   Plesimonas shigelloides NOT DETECTED NOT DETECTED Final   Salmonella species NOT DETECTED NOT DETECTED Final   Yersinia enterocolitica NOT DETECTED NOT DETECTED Final   Vibrio species NOT DETECTED NOT DETECTED Final   Vibrio cholerae NOT DETECTED NOT DETECTED Final   Enteroaggregative E coli (EAEC) NOT DETECTED NOT DETECTED Final   Enteropathogenic E coli (EPEC) NOT DETECTED NOT DETECTED Final   Enterotoxigenic E coli (ETEC) NOT DETECTED NOT DETECTED Final   Shiga like toxin producing E coli (STEC) NOT DETECTED NOT DETECTED Final   Shigella/Enteroinvasive E coli (EIEC) NOT DETECTED NOT DETECTED Final   Cryptosporidium NOT DETECTED NOT DETECTED Final   Cyclospora cayetanensis NOT DETECTED NOT DETECTED Final   Entamoeba histolytica NOT DETECTED NOT DETECTED Final   Giardia lamblia NOT DETECTED NOT DETECTED Final   Adenovirus F40/41 NOT DETECTED NOT DETECTED Final   Astrovirus NOT DETECTED NOT DETECTED Final   Norovirus GI/GII DETECTED (A) NOT DETECTED Final    Comment: RESULT CALLED TO, READ BACK BY AND VERIFIED WITH: KAITLYN SEY @1450  09/12/23 MJU    Rotavirus A NOT DETECTED NOT DETECTED Final   Sapovirus (I, II, IV, and V)  NOT DETECTED NOT DETECTED Final    Comment: Performed at [facility], [address], [place], [place] [address]  C Difficile Quick Screen w PCR reflex     Status: Abnormal   Collection Time: 09/12/23 11:36 AM   Specimen: STOOL  Result Value Ref Range Status   C Diff antigen POSITIVE (A) NEGATIVE Final   C Diff toxin NEGATIVE NEGATIVE Final   C Diff interpretation Results are indeterminate. See PCR results.  Final    Comment: Performed at [facility], [address], [place], [place] [address]  C. Diff by PCR, Reflexed     Status: Abnormal   Collection Time: 09/12/23 11:36 AM  Result Value Ref Range Status   Toxigenic C. Difficile by PCR POSITIVE (A) NEGATIVE Final    Comment: Positive for toxigenic C. difficile with little to no toxin production. Only treat if clinical presentation suggests symptomatic illness. Performed at [facility], [address], [place], [place] [address]      Radiology Studies: No results found.   Scheduled Meds:  aspirin EC  81 mg Oral Daily   enoxaparin (LOVENOX) injection  40 mg Subcutaneous Q24H   erythromycin  1 Application Right Eye TID   flecainide  50 mg Oral BID   metoprolol succinate  25 mg Oral Daily   pantoprazole  40 mg Oral Daily   vancomycin  125 mg Oral QID   Continuous Infusions:     LOS: 1 day   Hughie Closs, MD [facility]  09/14/2023, 11:22 AM   *Please note that this is a verbal dictation therefore any spelling or grammatical errors are due to the "[employer] One" system interpretation.  Please page via Amion and do not message via secure chat for urgent patient care matters. Secure chat can be used for non urgent patient care matters.  How to contact the [facility] Attending or Consulting provider 7A - 7P or covering provider during after hours 7P -7A, for this patient?  Check the care team in [facility] and look for a) attending/consulting [facility] provider listed and b) the [facility] team listed.  Page or secure chat 7A-7P. Log into [email or web address] and use [place]'s universal password to access. If you do not have the password, please contact the hospital operator. Locate the [facility] provider you are looking for under [facility] and page to a number that you can be directly reached. If you still have difficulty reaching the provider, please page the [facility] (Director on Call) for the Hospitalists listed on amion for assistance.

## 2023-09-15 ENCOUNTER — Other Ambulatory Visit (HOSPITAL_COMMUNITY): Payer: Self-pay

## 2023-09-15 DIAGNOSIS — R197 Diarrhea, unspecified: Secondary | ICD-10-CM | POA: Diagnosis not present

## 2023-09-15 LAB — COMPREHENSIVE METABOLIC PANEL
ALT: 53 U/L — ABNORMAL HIGH (ref 0–44)
AST: 46 U/L — ABNORMAL HIGH (ref 15–41)
Albumin: 3.6 g/dL (ref 3.5–5.0)
Alkaline Phosphatase: 53 U/L (ref 38–126)
Anion gap: 8 (ref 5–15)
BUN: 10 mg/dL (ref 8–23)
CO2: 25 mmol/L (ref 22–32)
Calcium: 8.7 mg/dL — ABNORMAL LOW (ref 8.9–10.3)
Chloride: 103 mmol/L (ref 98–111)
Creatinine, Ser: 0.87 mg/dL (ref 0.61–1.24)
GFR, Estimated: >60 mL/min (ref >60)
Glucose, Bld: 105 mg/dL — ABNORMAL HIGH (ref 70–99)
Potassium: 4 mmol/L (ref 3.5–5.1)
Sodium: 136 mmol/L (ref 135–145)
Total Bilirubin: 0.9 mg/dL (ref <1.2)
Total Protein: 6.4 g/dL — ABNORMAL LOW (ref 6.5–8.1)

## 2023-09-15 MED ORDER — VANCOMYCIN HCL 125 MG PO CAPS
125.0000 mg | ORAL_CAPSULE | Freq: Four times a day (QID) | ORAL | 0 refills | Status: AC
  Filled 2023-09-15: qty 28, 7d supply, fill #0

## 2023-09-15 MED ORDER — ONDANSETRON 4 MG PO TBDP
4.0000 mg | ORAL_TABLET | Freq: Three times a day (TID) | ORAL | 0 refills | Status: AC | PRN
  Filled 2023-09-15: qty 20, 7d supply, fill #0

## 2023-09-15 MED ORDER — ORAL CARE MOUTH RINSE: 15.0000 mL | OROMUCOSAL | Status: DC | PRN

## 2023-09-15 NOTE — Discharge Summary (Signed)
Physician Discharge Summary  Terry Guzman XBJ:[id number] DOB: 27-Jun-1961 DOA: 09/11/2023  PCP: April Manson, NP  Admit date: 09/11/2023 Discharge date: 09/15/2023 30 Day Unplanned Readmission Risk Score    Flowsheet Row ED to Hosp-Admission (Current) from 09/11/2023 in [facility]TH FLOOR PROGRESSIVE CARE AND UROLOGY  30 Day Unplanned Readmission Risk Score (%) 10.6 Filed at 09/15/2023 0801       This score is the patient's risk of an unplanned readmission within 30 days of being discharged (0 -100%). The score is based on dignosis, age, lab data, medications, orders, and past utilization.   Low:  0-14.9   Medium: 15-21.9   High: 22-29.9   Extreme: 30 and above          Admitted From: Home Disposition: Home  Recommendations for Outpatient Follow-up:  Follow up with PCP in 1-2 weeks Please obtain BMP/CBC in one week Please follow up with your PCP on the following pending results: Unresulted Labs (From admission, onward)     Start     Ordered   09/18/23 0500  Creatinine, serum  (enoxaparin (LOVENOX)    CrCl >/= 30 ml/min)  Weekly,   R     Comments: while on enoxaparin therapy    09/11/23 2327              Home Health: None Equipment/Devices: None  Discharge Condition: Stable CODE STATUS: Full code Diet recommendation: Cardiac  Subjective: Seen and examined.  Feels much better.  Other than heartburn, no complaints.  He has had 3 bowel movements in last 24 hours and feels comfortable going home.  Brief/Interim Summary: 62 year old with past medical history significant for A-fib, CAD, hyperlipidemia, hypertension, GERD who presents with nausea vomiting and diarrhea. He reports left lower quadrant abdominal pain. He ate refrigerated fluids 3 days ago at his work. CT showed no clear-cut colitis but fluid-filled colon.  Patient was admitted under hospitalist service for following.   Sepsis secondary to viral gastroenteritis and C diff colitis, POA: Presented  with fever 101.9, tachycardia heart rate 125, tachypnea respiration rate 24, lactic acid 3.1.  CT abdomen and pelvis: Fluid-filled nondilated colon consistent with incipient diarrhea.  Hepatic and renal cysts.  GI pathogen panel positive for norovirus.  C. difficile antigen positive, toxin negative but PCR positive.  Consistent with active C. difficile infection.  He was started on oral vancomycin.  He has had only 3 bowel movements in last 24 hours, the recent 2 were semisolid.  He has no other complaint, tolerating diet.  Feels comfortable going home.  Received 3 days of vancomycin.  Will prescribe 7 more days of vancomycin and Zofran ODT so he has that handy in case he feels nauseous.  Discussed in length with patient that he needs to seek medical attention in first call his PCP if he were to develop excessive diarrhea, abdominal distention, pain, fever or nausea or vomiting.  He verbalized understanding.   Elevated LFTs: Likely secondary to sepsis.  LFTs improving.  Repeat CMP at PCPs office in a week.   Hypokalemia: Resolved.   GERD: Continue PPI   CAD, A fib  Continue metoprolol, flecainide and aspirin.  Not on any anticoagulation.   Hyperlipidemia: on Repatha out patient but also on atorvastatin which I will hold due to elevated LFTs.   Ptosis repair Internal 09/09/2023 By Dr Dairl Ponder.  On antibiotics ointment.    Mild hyponatremia: Resolved  Discharge plan was discussed with patient and/or family member and they verbalized understanding  and agreed with it.  Discharge Diagnoses:  Principal Problem:   Diarrhea with dehydration Active Problems:   Dyslipidemia   CORONARY ATHEROSCLEROSIS NATIVE CORONARY ARTERY   ATRIAL FIBRILLATION   Sepsis (HCC)    Discharge Instructions   Allergies as of 09/15/2023       Reactions   Clarithromycin    Unknown    Ezetimibe Diarrhea   Levofloxacin    Unknown    Statins    Joint pain        Medication List     STOP taking  these medications    amoxicillin-clavulanate 875-125 MG tablet Commonly known as: AUGMENTIN   benzonatate 100 MG capsule Commonly known as: TESSALON   promethazine-dextromethorphan 6.25-15 MG/5ML syrup Commonly known as: PROMETHAZINE-DM   Xifaxan 550 MG Tabs tablet Generic drug: rifaximin       TAKE these medications    ALPRAZolam 0.5 MG tablet Commonly known as: XANAX Take 0.25 mg by mouth daily as needed for anxiety.   aspirin 81 MG tablet Take 81 mg by mouth daily.   atorvastatin 20 MG tablet Commonly known as: LIPITOR Take 20 mg by mouth daily.   erythromycin ophthalmic ointment Place 1 Application into the right eye 3 (three) times daily.   esomeprazole 40 MG capsule Commonly known as: NEXIUM Take 40 mg by mouth daily before breakfast.   Fish Oil 1200 MG Caps Take by mouth 2 (two) times daily.   flecainide 50 MG tablet Commonly known as: TAMBOCOR TAKE 1 TABLET BY MOUTH TWICE A DAY   metoprolol succinate 25 MG 24 hr tablet Commonly known as: TOPROL-XL Take 1 tablet (25 mg total) by mouth daily.   ondansetron 4 MG disintegrating tablet Commonly known as: ZOFRAN-ODT Take 1 tablet (4 mg total) by mouth every 8 (eight) hours as needed for nausea or vomiting.   Repatha SureClick 140 MG/ML Soaj Generic drug: Evolocumab Inject 140 mg into the skin every 14 (fourteen) days.   sildenafil 20 MG tablet Commonly known as: REVATIO Take 40 mg by mouth as needed (ED).   vancomycin 125 MG capsule Commonly known as: VANCOCIN Take 1 capsule (125 mg total) by mouth 4 (four) times daily for 7 days.   Xiidra 5 % Soln Generic drug: Lifitegrast Place 1 drop into both eyes in the morning and at bedtime.        Follow-up Information     April Manson, NP Follow up in 1 week(s).   Specialty: Family Medicine Contact information: [address] [place] [address] [phone]                Allergies  Allergen Reactions   Clarithromycin      Unknown    Ezetimibe Diarrhea   Levofloxacin     Unknown    Statins     Joint pain    Consultations: None   Procedures/Studies: CT ABDOMEN PELVIS W CONTRAST Result Date: 09/11/2023 CLINICAL DATA:  Abdominal pain. EXAM: CT ABDOMEN AND PELVIS WITH CONTRAST TECHNIQUE: Multidetector CT imaging of the abdomen and pelvis was performed using the standard protocol following bolus administration of intravenous contrast. RADIATION DOSE REDUCTION: This exam was performed according to the departmental dose-optimization program which includes automated exposure control, adjustment of the mA and/or kV according to patient size and/or use of iterative reconstruction technique. CONTRAST:  OMNIPAQUE IOHEXOL 300 MG/ML  SOLN COMPARISON:  04/28/2010. FINDINGS: Lower chest: Bases clear.  No pleural or pericardial effusion Hepatobiliary: Status post cholecystectomy. No  biliary ductal dilatation. Numerous small bilobar hepatic cysts. Pancreas: Unremarkable. No pancreatic ductal dilatation or surrounding inflammatory changes. Spleen: Normal in size without focal abnormality. Adrenals/Urinary Tract: No adrenal lesions. 8 cm cyst left kidney. Additional small cysts bilaterally. Stomach/Bowel: No bowel dilatation to suggest obstruction. Unremarkable stomach. Small hiatal hernia. Normal appendix. Diffuse fluid-filled nondilated colon consistent with incipient diarrhea. Vascular/Lymphatic: Aortic atherosclerosis. No enlarged abdominal or pelvic lymph nodes. Reproductive: Prostate is unremarkable. Other: No abdominal wall hernia or abnormality. No abdominopelvic ascites. Musculoskeletal: No acute or significant osseous findings. IMPRESSION: 1. Fluid-filled nondilated colon consistent with incipient diarrhea. 2. Hepatic and renal cysts. 3. Small hiatal hernia. 4. Aortic Atherosclerosis (ICD10-I70.0). Electronically Signed   By: Layla Maw M.D.   On: 09/11/2023 20:01     Discharge Exam: Vitals:   09/14/23 2102  09/15/23 0511  BP: 119/69 118/64  Pulse: 66 66  Resp: 18 18  Temp: 98 F (36.7 C) 98.3 F (36.8 C)  SpO2: 96% 96%   Vitals:   09/14/23 0527 09/14/23 1213 09/14/23 2102 09/15/23 0511  BP: 129/68 124/73 119/69 118/64  Pulse: 76 74 66 66  Resp: 20 20 18 18   Temp: 98.4 F (36.9 C) 98.1 F (36.7 C) 98 F (36.7 C) 98.3 F (36.8 C)  TempSrc: Oral Oral Oral Oral  SpO2: 97% 98% 96% 96%  Weight:      Height:        General: Pt is alert, awake, not in acute distress Cardiovascular: RRR, S1/S2 +, no rubs, no gallops Respiratory: CTA bilaterally, no wheezing, no rhonchi Abdominal: Soft, NT, ND, bowel sounds + Extremities: no edema, no cyanosis    The results of significant diagnostics from this hospitalization (including imaging, microbiology, ancillary and laboratory) are listed below for reference.     Microbiology: Recent Results (from the past 240 hours)  Blood culture (routine x 2)     Status: None (Preliminary result)   Collection Time: 09/11/23  5:58 PM   Specimen: BLOOD RIGHT HAND  Result Value Ref Range Status   Specimen Description   Final    BLOOD RIGHT HAND Performed at [facility], [address] [place], [place] [address]    Special Requests   Final    BOTTLES DRAWN AEROBIC ONLY Blood Culture results may not be optimal due to an inadequate volume of blood received in culture bottles Performed at [facility], [address] [place], [place] [address]    Culture   Final    NO GROWTH 4 DAYS Performed at [facility], [address], [place], [place] [address]    Report Status PENDING  Incomplete  Blood culture (routine x 2)     Status: None (Preliminary result)   Collection Time: 09/11/23  6:07 PM   Specimen: BLOOD  Result Value Ref Range Status   Specimen Description   Final    BLOOD RIGHT ANTECUBITAL Performed at [facility], [address] [place], [place] [address]    Special Requests   Final    BOTTLES DRAWN  AEROBIC AND ANAEROBIC Blood Culture results may not be optimal due to an inadequate volume of blood received in culture bottles Performed at [facility], [address] [place], [place] [address]    Culture   Final    NO GROWTH 4 DAYS Performed at [facility], [address] [place], [place] [address]    Report Status PENDING  Incomplete  Gastrointestinal Panel by PCR , Stool     Status: Abnormal   Collection Time: 09/12/23  1:32 AM   Specimen: Stool  Result Value Ref Range Status   Campylobacter species NOT DETECTED NOT DETECTED Final   Plesimonas shigelloides NOT DETECTED NOT DETECTED Final   Salmonella species NOT DETECTED NOT DETECTED Final   Yersinia enterocolitica NOT DETECTED NOT DETECTED Final   Vibrio species NOT DETECTED NOT DETECTED Final   Vibrio cholerae NOT DETECTED NOT DETECTED Final   Enteroaggregative E coli (EAEC) NOT DETECTED NOT DETECTED Final   Enteropathogenic E coli (EPEC) NOT DETECTED NOT DETECTED Final   Enterotoxigenic E coli (ETEC) NOT DETECTED NOT DETECTED Final   Shiga like toxin producing E coli (STEC) NOT DETECTED NOT DETECTED Final   Shigella/Enteroinvasive E coli (EIEC) NOT DETECTED NOT DETECTED Final   Cryptosporidium NOT DETECTED NOT DETECTED Final   Cyclospora cayetanensis NOT DETECTED NOT DETECTED Final   Entamoeba histolytica NOT DETECTED NOT DETECTED Final   Giardia lamblia NOT DETECTED NOT DETECTED Final   Adenovirus F40/41 NOT DETECTED NOT DETECTED Final   Astrovirus NOT DETECTED NOT DETECTED Final   Norovirus GI/GII DETECTED (A) NOT DETECTED Final    Comment: RESULT CALLED TO, READ BACK BY AND VERIFIED WITH: KAITLYN SEY @1450  09/12/23 MJU    Rotavirus A NOT DETECTED NOT DETECTED Final   Sapovirus (I, II, IV, and V) NOT DETECTED NOT DETECTED Final    Comment: Performed at [facility], [address] [place], [place] [address]  C Difficile Quick Screen w PCR reflex     Status: Abnormal   Collection Time:  09/12/23 11:36 AM   Specimen: STOOL  Result Value Ref Range Status   C Diff antigen POSITIVE (A) NEGATIVE Final   C Diff toxin NEGATIVE NEGATIVE Final   C Diff interpretation Results are indeterminate. See PCR results.  Final    Comment: Performed at [facility], [address], [place], [place] [address]  C. Diff by PCR, Reflexed     Status: Abnormal   Collection Time: 09/12/23 11:36 AM  Result Value Ref Range Status   Toxigenic C. Difficile by PCR POSITIVE (A) NEGATIVE Final    Comment: Positive for toxigenic C. difficile with little to no toxin production. Only treat if clinical presentation suggests symptomatic illness. Performed at [facility], [address], [place], [place] [address]      Labs: BNP (last 3 results) No results for input(s): "BNP" in the last 8760 hours. Basic Metabolic Panel: Recent Labs  Lab 09/11/23 1729 09/12/23 1448 09/13/23 0510 09/14/23 0902 09/15/23 0519  NA 137 132* 134* 134* 136  K 4.6 3.0* 4.2 4.0 4.0  CL 104 104 104 103 103  CO2 21* 21* 26 23 25   GLUCOSE 152* 113* 99 94 105*  BUN 20 16 12 9 10   CREATININE 1.18 0.95 0.95 0.83 0.87  CALCIUM 7.9* 7.5* 7.8* 8.3* 8.7*   Liver Function Tests: Recent Labs  Lab 09/11/23 1729 09/12/23 1448 09/15/23 0519  AST 36 59* 46*  ALT 45* 72* 53*  ALKPHOS 66 51 53  BILITOT 1.2* 0.9 0.9  PROT 7.0 5.9* 6.4*  ALBUMIN 3.7 3.2* 3.6   Recent Labs  Lab 09/11/23 1729  LIPASE 26   No results for input(s): "AMMONIA" in the last 168 hours. CBC: Recent Labs  Lab 09/11/23 1642 09/12/23 0533 09/13/23 0510 09/14/23 0902  WBC 6.3 4.4 3.8* 3.9*  NEUTROABS 5.6  --   --  2.7  HGB 16.7 14.6 13.5 13.8  HCT 48.1 43.6 39.6 40.3  MCV 83.1 86.5 85.9 85.0  PLT 213 179 139* 164   Cardiac Enzymes: No results for input(s): "CKTOTAL", "CKMB", "CKMBINDEX", "TROPONINI" in the last 168 hours. BNP: Invalid input(s): "POCBNP" CBG: No results for input(s): "GLUCAP" in the last 168  hours. D-Dimer No results for input(s): "DDIMER" in the last 72 hours. Hgb A1c No results for input(s): "HGBA1C" in the last 72 hours. Lipid Profile No results for input(s): "CHOL", "HDL", "LDLCALC", "TRIG", "CHOLHDL", "LDLDIRECT" in the last 72 hours. Thyroid function studies No results for input(s): "TSH", "T4TOTAL", "T3FREE", "THYROIDAB" in the last 72 hours.  Invalid input(s): "FREET3" Anemia work up No results for input(s): "VITAMINB12", "FOLATE", "FERRITIN", "TIBC", "IRON", "RETICCTPCT" in the last 72 hours. Urinalysis    Component Value Date/Time   COLORURINE YELLOW 04/28/2010 0720   APPEARANCEUR CLOUDY (A) 04/28/2010 0720   LABSPEC 1.018 04/28/2010 0720   PHURINE 7.0 04/28/2010 0720   GLUCOSEU NEGATIVE 04/28/2010 0720   HGBUR NEGATIVE 04/28/2010 0720   BILIRUBINUR NEGATIVE 04/28/2010 0720   KETONESUR NEGATIVE 04/28/2010 0720   PROTEINUR NEGATIVE 04/28/2010 0720   UROBILINOGEN 0.2 04/28/2010 0720   NITRITE NEGATIVE 04/28/2010 0720   LEUKOCYTESUR  04/28/2010 0720    NEGATIVE MICROSCOPIC NOT DONE ON URINES WITH NEGATIVE PROTEIN, BLOOD, LEUKOCYTES, NITRITE, OR GLUCOSE <1000 mg/dL.   Sepsis Labs Recent Labs  Lab 09/11/23 1642 09/12/23 0533 09/13/23 0510 09/14/23 0902  WBC 6.3 4.4 3.8* 3.9*   Microbiology Recent Results (from the past 240 hours)  Blood culture (routine x 2)     Status: None (Preliminary result)   Collection Time: 09/11/23  5:58 PM   Specimen: BLOOD RIGHT HAND  Result Value Ref Range Status   Specimen Description   Final    BLOOD RIGHT HAND Performed at [facility], [address] [place], [place] [address]    Special Requests   Final    BOTTLES DRAWN AEROBIC ONLY Blood Culture results may not be optimal due to an inadequate volume of blood received in culture bottles Performed at [facility], [address] [place], [place] [address]    Culture   Final    NO GROWTH 4 DAYS Performed at [facility], [address], [place], [place] [address]    Report Status PENDING  Incomplete  Blood culture (routine x 2)     Status: None (Preliminary result)   Collection Time: 09/11/23  6:07 PM   Specimen: BLOOD  Result Value Ref Range Status   Specimen Description   Final    BLOOD RIGHT ANTECUBITAL Performed at [facility], [address] [place], [place] [address]    Special Requests   Final    BOTTLES DRAWN AEROBIC AND ANAEROBIC Blood Culture results may not be optimal due to an inadequate volume of blood received in culture bottles Performed at [facility], [address] [place], [place] [address]    Culture   Final    NO GROWTH 4 DAYS Performed at [facility], [address], [place], [place] [address]    Report Status PENDING  Incomplete  Gastrointestinal Panel by PCR , Stool     Status: Abnormal   Collection Time: 09/12/23  1:32 AM   Specimen: Stool  Result Value Ref Range Status   Campylobacter species NOT DETECTED NOT DETECTED Final   Plesimonas shigelloides NOT DETECTED NOT DETECTED  Final   Salmonella species NOT DETECTED NOT DETECTED Final   Yersinia enterocolitica NOT DETECTED NOT DETECTED Final   Vibrio species NOT DETECTED NOT DETECTED Final   Vibrio cholerae NOT DETECTED NOT DETECTED Final   Enteroaggregative E coli (EAEC) NOT DETECTED NOT DETECTED Final   Enteropathogenic E coli (EPEC) NOT DETECTED NOT DETECTED Final   Enterotoxigenic E coli (ETEC) NOT DETECTED NOT DETECTED Final   Shiga like toxin producing E coli (STEC) NOT DETECTED NOT DETECTED Final   Shigella/Enteroinvasive E coli (EIEC) NOT DETECTED NOT DETECTED Final   Cryptosporidium NOT DETECTED NOT DETECTED Final   Cyclospora cayetanensis NOT DETECTED NOT DETECTED Final   Entamoeba histolytica NOT DETECTED NOT DETECTED Final   Giardia lamblia NOT DETECTED NOT DETECTED Final   Adenovirus F40/41 NOT DETECTED NOT DETECTED Final   Astrovirus NOT DETECTED NOT DETECTED Final   Norovirus GI/GII  DETECTED (A) NOT DETECTED Final    Comment: RESULT CALLED TO, READ BACK BY AND VERIFIED WITH: KAITLYN SEY @1450  09/12/23 MJU    Rotavirus A NOT DETECTED NOT DETECTED Final   Sapovirus (I, II, IV, and V) NOT DETECTED NOT DETECTED Final    Comment: Performed at [facility], [address] [place], [place] [address]  C Difficile Quick Screen w PCR reflex     Status: Abnormal   Collection Time: 09/12/23 11:36 AM   Specimen: STOOL  Result Value Ref Range Status   C Diff antigen POSITIVE (A) NEGATIVE Final   C Diff toxin NEGATIVE NEGATIVE Final   C Diff interpretation Results are indeterminate. See PCR results.  Final    Comment: Performed at [facility], [address], [place], [place] [address]  C. Diff by PCR, Reflexed     Status: Abnormal   Collection Time: 09/12/23 11:36 AM  Result Value Ref Range Status   Toxigenic C. Difficile by PCR POSITIVE (A) NEGATIVE Final    Comment: Positive for toxigenic C. difficile with little to no toxin production. Only treat if clinical presentation suggests symptomatic illness. Performed at [facility], [address] [place], [place] [address]     FURTHER DISCHARGE INSTRUCTIONS:   Get Medicines reviewed and adjusted: Please take all your medications with you for your next visit with your Primary MD   Laboratory/radiological data: Please request your Primary MD to go over all hospital tests and procedure/radiological results at the follow up, please ask your Primary MD to get all Hospital records sent to his/her office.   In some cases, they will be blood work, cultures and biopsy results pending at the time of your discharge. Please request that your primary care M.D. goes through all the records of your hospital data and follows up on these results.   Also Note the following: If you experience worsening of your admission symptoms, develop shortness of breath, life threatening emergency, suicidal or homicidal  thoughts you must seek medical attention immediately by calling 911 or calling your MD immediately  if symptoms less severe.   You must read complete instructions/literature along with all the possible adverse reactions/side effects for all the Medicines you take and that have been prescribed to you. Take any new Medicines after you have completely understood and accpet all the possible adverse reactions/side effects.    Do not drive when taking Pain medications or sleeping medications (Benzodaizepines)   Do not take more than prescribed Pain, Sleep and Anxiety Medications. It is not advisable to combine anxiety,sleep and pain medications without  talking with your primary care practitioner   Special Instructions: If you have smoked or chewed Tobacco  in the last 2 yrs please stop smoking, stop any regular Alcohol  and or any Recreational drug use.   Wear Seat belts while driving.   Please note: You were cared for by a hospitalist during your hospital stay. Once you are discharged, your primary care physician will handle any further medical issues. Please note that NO REFILLS for any discharge medications will be authorized once you are discharged, as it is imperative that you return to your primary care physician (or establish a relationship with a primary care physician if you do not have one) for your post hospital discharge needs so that they can reassess your need for medications and monitor your lab values  Time coordinating discharge: Over 30 minutes  SIGNED:   Hughie Closs, MD  [facility] 09/15/2023, 10:56 AM *Please note that this is a verbal dictation therefore any spelling or grammatical errors are due to the "Dragon [employer] One" system interpretation. If 7PM-7AM, please contact night-coverage [email or web address]

## 2023-09-15 NOTE — Discharge Instructions (Signed)
Please seek medical attention and contact your PCP if you develop fever, excessive diarrhea, more than 5-6 episodes a day, increasing abdominal distention, abdominal pain or nausea or vomiting.

## 2023-09-16 LAB — CULTURE, BLOOD (ROUTINE X 2): Culture: NO GROWTH

## 2023-11-16 ENCOUNTER — Ambulatory Visit: Payer: Federal, State, Local not specified - PPO | Admitting: Internal Medicine

## 2023-11-18 ENCOUNTER — Ambulatory Visit: Payer: Federal, State, Local not specified - PPO | Admitting: Cardiology

## 2023-11-19 ENCOUNTER — Other Ambulatory Visit: Payer: Self-pay | Admitting: Physician Assistant

## 2023-11-25 IMAGING — CT CT CARDIAC CORONARY ARTERY CALCIUM SCORE
3 series · 13 of 20 positions shown, 15 images · non-contrast
Comparison: 06/18/2010 chest radiograph.

Addendum:
CLINICAL DATA: Risk stratification: 60 Year-old White Male

EXAM:
Coronary Calcium Score
TECHNIQUE: The patient was scanned on a Siemens Force scanner. Axial
non-contrast 3 mm slices were carried out through the heart. The
data set was analyzed on a dedicated work station and scored using
the Agatson method.

[Series 2: cascseq 2.0 sa36 70% (id) · axial · 0.44mm/px · z∈[-287,-233]mm · 3 of 68 slices shown]
[im 14/68  vessel]
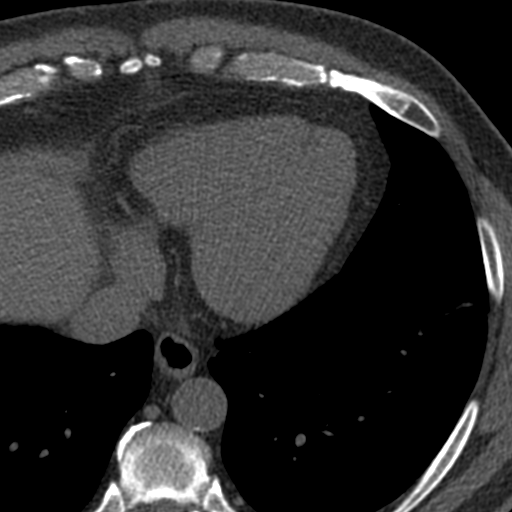
[im 27/68  vessel]
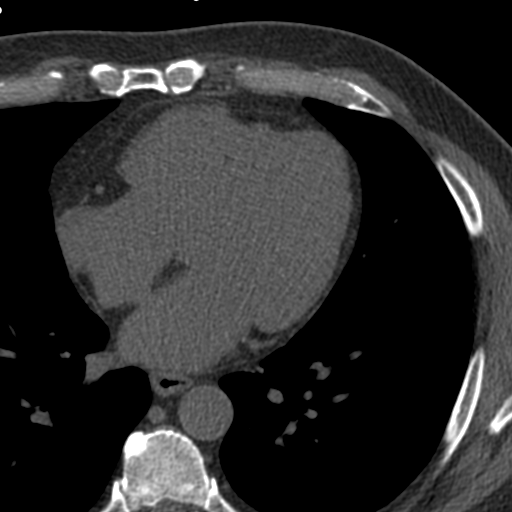
[im 41/68  vessel]
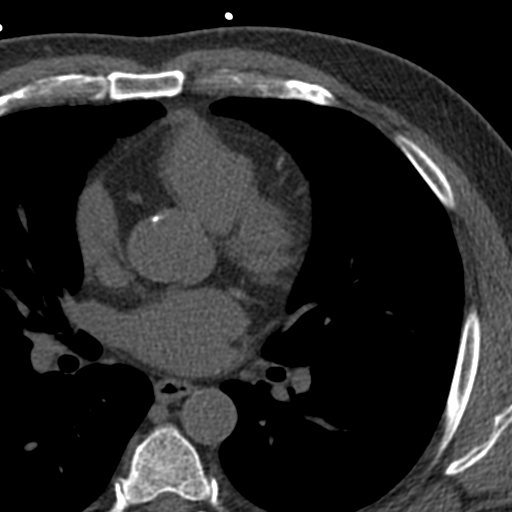

[Series 3: cascseq 2.0 bf37 st · axial · 0.76mm/px · z∈[-291,-203]mm · 5 of 68 slices shown, 7 images]
[im 12/68  vessel]
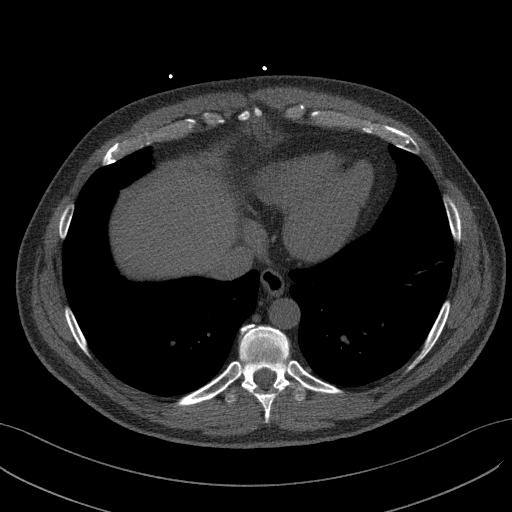
[im 12/68  lung]
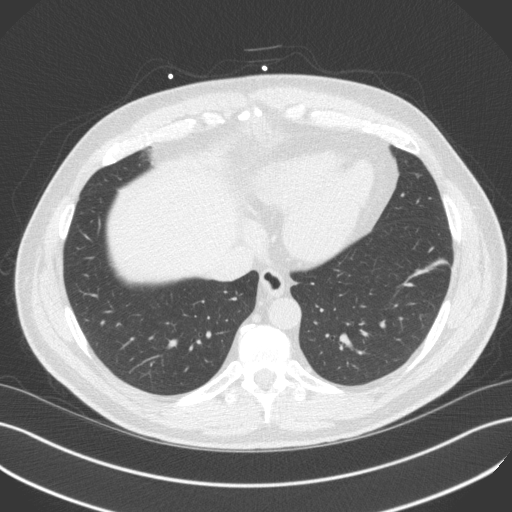
[im 23/68  vessel]
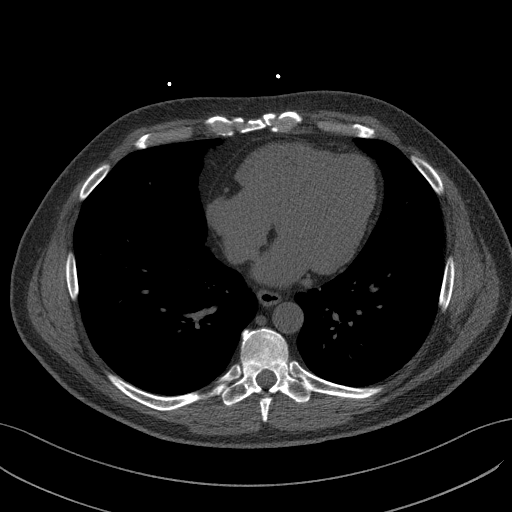
[im 34/68  vessel]
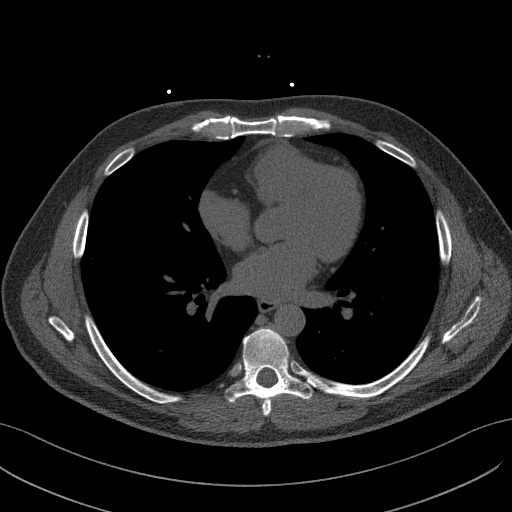
[im 45/68  vessel]
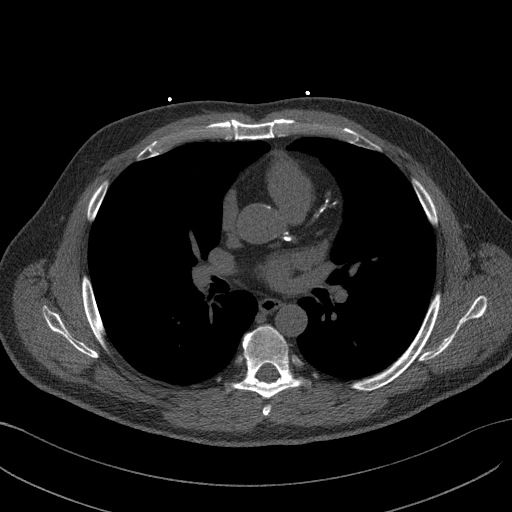
[im 56/68  vessel]
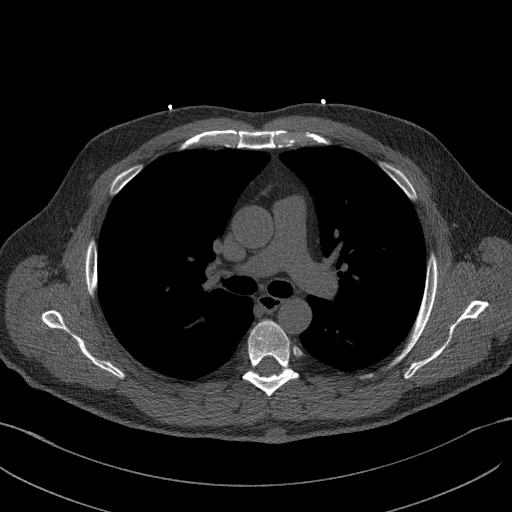
[im 56/68  lung]
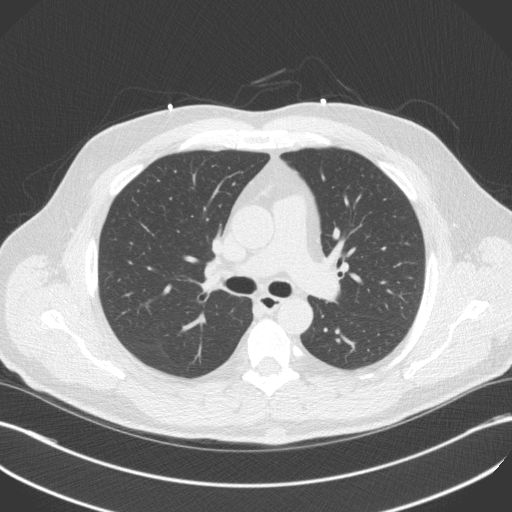

[Series 4: cascseq 2.0 br59 lung · axial · 0.76mm/px · z∈[-291,-203]mm · 5 of 68 slices shown]
[im 12/68  lung]
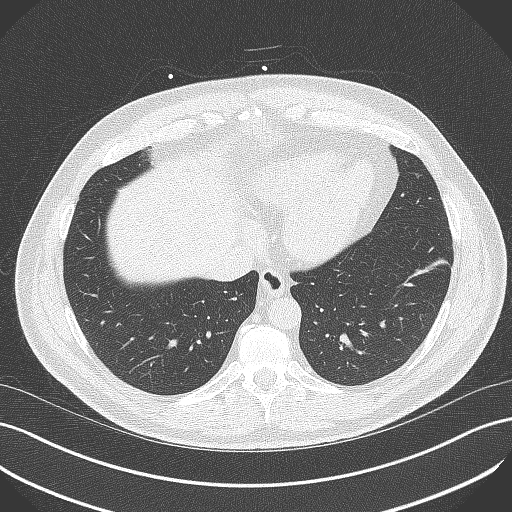
[im 23/68  lung]
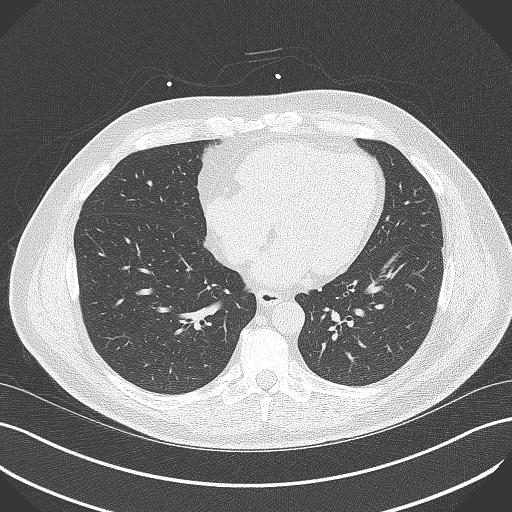
[im 34/68  lung]
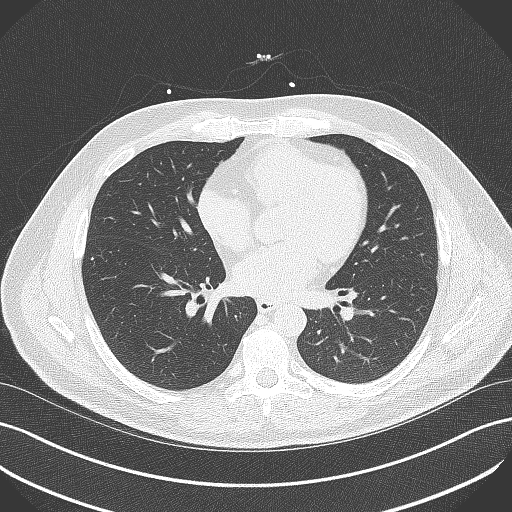
[im 45/68  lung]
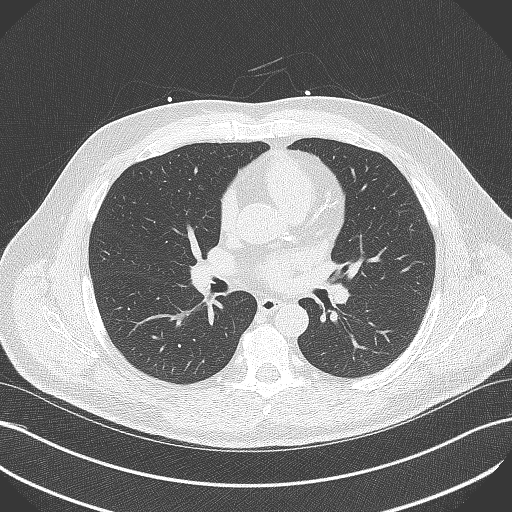
[im 56/68  lung]
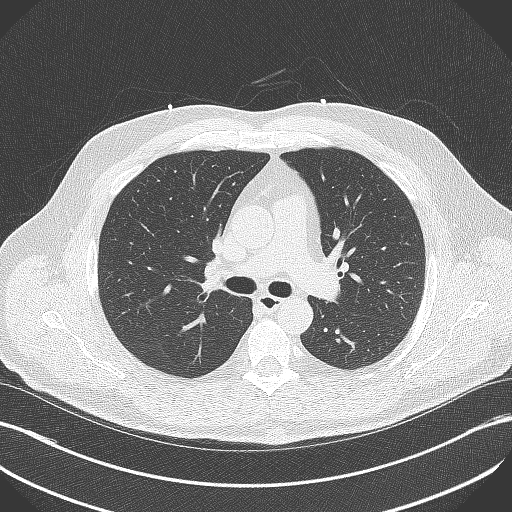

[13 of 20 positions shown; findings below may reference images not displayed]

FINDINGS: Non-cardiac: See separate report from [REDACTED].

Ascending Aorta: Normal caliber.

Aortic atherosclerosis.

Pericardium: Normal.

Coronary arteries: Normal origins.

Coronary Calcium Score:

Left main: 134

Left anterior descending artery: 131

Left circumflex artery: 62

Right coronary artery: 39

Total: 366

Percentile: 87th for age, sex, and race matched control.
IMPRESSION: 1. Coronary calcium score of 366. This was 87th percentile for age,
gender, and race matched controls.

2.  Aortic atherosclerosis.

RECOMMENDATIONS:



If CAC = 0, it is reasonable to withhold statin therapy and reassess
in 5 to 10 years, as long as higher risk conditions are absent
(diabetes mellitus, family history of premature CHD in first degree
relatives (males <55 years; females <65 years), cigarette smoking,
LDL >=190 mg/dL or other independent risk factors).

If CAC is 1 to 99, it is reasonable to initiate statin therapy for
patients >=55 years of age.

If CAC is >=100 or >=75th percentile, it is reasonable to initiate
statin therapy at any age.

Cardiology referral should be considered for patients with CAC
scores =400 or >=75th percentile.

*8635 AHA/ACC/AACVPR/AAPA/ABC/TAYO/NERISSA/RIVERA/Koji/NYA/GIORGI/AICHA
Guideline on the Management of Blood Cholesterol: A Report of the
American College of Cardiology/American Heart Association Task Force
on Clinical Practice Guidelines. J Am Coll Cardiol.
7575;73(24):6511-6591.

EXAM:
OVER-READ INTERPRETATION  CT CHEST

The following report is an over-read performed by radiologist Dr.
does not include interpretation of cardiac or coronary anatomy or
pathology. The coronary calcium score interpretation by the
cardiologist is attached.
FINDINGS: Cardiovascular: Normal heart size. No significant pericardial
effusion/thickening. Atherosclerotic nonaneurysmal thoracic aorta.
Normal caliber pulmonary arteries.

Mediastinum/Nodes: Unremarkable esophagus. No pathologically
enlarged mediastinal or hilar lymph nodes, noting limited
sensitivity for the detection of hilar adenopathy on this
noncontrast study.

Lungs/Pleura: No pneumothorax. No pleural effusion. No acute
consolidative airspace disease or lung masses. Tiny solid 1 mm
posterior right middle lobe pulmonary nodule (series 4/image 42).
Tiny calcified 3 mm peripheral right lower lobe granuloma. No
additional significant pulmonary nodules. Minimal curvilinear
scarring versus atelectasis in the left lower lobe.

Upper abdomen: Subcentimeter hypodense left liver dome lesion, too
small to characterize.

Musculoskeletal: No aggressive appearing focal osseous lesions.
Superficial subcutaneous 1.8 cm soft tissue density lesion in the
midline back (series 3/image 15).
IMPRESSION: 1. Superficial subcutaneous 1.8 cm soft tissue density lesion in the
midline back, nonspecific, most commonly a sebaceous cyst. Recommend
correlation with directed physical exam.
2. Tiny solid 1 mm posterior right middle lobe pulmonary nodule. No
follow-up needed if patient is low-risk. Non-contrast chest CT can
be considered in 12 months if patient is high-risk. This
recommendation follows the consensus statement: Guidelines for
Management of Incidental Pulmonary Nodules Detected on CT Images:
3. Aortic Atherosclerosis ([id number].I).

*** End of Addendum ***
FINDINGS: Non-cardiac: See separate report from [REDACTED].

Ascending Aorta: Normal caliber.

Aortic atherosclerosis.

Pericardium: Normal.

Coronary arteries: Normal origins.

Coronary Calcium Score:

Left main: 134

Left anterior descending artery: 131

Left circumflex artery: 62

Right coronary artery: 39

Total: 366

Percentile: 87th for age, sex, and race matched control.
IMPRESSION: 1. Coronary calcium score of 366. This was 87th percentile for age,
gender, and race matched controls.

2.  Aortic atherosclerosis.

RECOMMENDATIONS:



If CAC = 0, it is reasonable to withhold statin therapy and reassess
in 5 to 10 years, as long as higher risk conditions are absent
(diabetes mellitus, family history of premature CHD in first degree
relatives (males <55 years; females <65 years), cigarette smoking,
LDL >=190 mg/dL or other independent risk factors).

If CAC is 1 to 99, it is reasonable to initiate statin therapy for
patients >=55 years of age.

If CAC is >=100 or >=75th percentile, it is reasonable to initiate
statin therapy at any age.

Cardiology referral should be considered for patients with CAC
scores =400 or >=75th percentile.

*8635 AHA/ACC/AACVPR/AAPA/ABC/TAYO/NERISSA/RIVERA/Koji/NYA/GIORGI/AICHA
Guideline on the Management of Blood Cholesterol: A Report of the
American College of Cardiology/American Heart Association Task Force
on Clinical Practice Guidelines. J Am Coll Cardiol.
7575;73(24):6511-6591.

## 2023-11-28 NOTE — Progress Notes (Unsigned)
 Cardiology Office Note    Patient Name: Terry Guzman Date of Encounter: 11/29/2023  Primary Care Provider:  April Manson, NP Primary Cardiologist:  Dietrich Pates, MD Primary Electrophysiologist: None   Past Medical History    Past Medical History:  Diagnosis Date   Atrial fibrillation [facility])    on Flecainide   CAD (coronary artery disease)    minimal per cath in November 2011 with mild plaquing.    Dyspnea    Glucose intolerance (impaired glucose tolerance)    High risk medication use    Flecainide therapy   HLD (hyperlipidemia)    HTN (hypertension)     History of Present Illness  Terry Guzman is a 63 y.o. male with a PMH of persistent AF, CAD, HLD, HTN, GERD who presents today for 1 year follow-up.  Terry Guzman has been followed by Dr. Tenny Craw since the early 2000's for management of atrial fibrillation and CAD.  He completed a Myoview in 2008 that was normal.  He also completed a LHC in [place] in 2006 that was normal.  He underwent a repeat LHC in 2011 that showed minimal CAD.  He is currently managed with flecainide for paroxysmal AF and is on ASA 81 mg due to CHA2DS2-VASc score of 1.  He was last seen by Dr. Tenny Craw in 08/2021 and reported doing well with no new cardiac complaints.  During visit he reported no recurrence of atrial fibrillation.  He was last seen on 11/13/2022 for 1 year follow-up.  During visit he received clearance for for meniscus knee surgery as well as eye surgery.  He had no new cardiac complaints and was provided clearance for his procedures.  He was hospitalized in December for viral gastroenteritis and C. difficile secondary to sepsis.  He has been a total of 4 days in the hospital and was discharged home in stable condition.  Terry Guzman  presents for an annual cardiology follow-up. He was hospitalized for four days in December due to a severe C. diff infection and sepsis secondary to norovirus. Since then, he reports feeling fine with no new  cardiac symptoms. He has not had any episodes of atrial fibrillation since his last two episodes, which occurred post-marathon and were associated with syncope and a transient ischemic attack. He is able to recognize the symptoms of atrial fibrillation, including palpitations and an irregular pulse. He remains active, walking two miles daily at work and walking his dogs in the evenings and on weekends. He has maintained his weight loss for the past couple of years. He is on Lipitor and Repatha for hyperlipidemia, and his cholesterol levels were at goal during his last check in 2023. He is due for his annual physical with his primary care provider this month. He is also on flecainide for atrial fibrillation and reports no issues with this medication. He was previously evaluated for meniscus surgery but ultimately did not require the procedure.  Patient denies chest pain, palpitations, dyspnea, PND, orthopnea, nausea, vomiting, dizziness, syncope, edema, weight gain, or early satiety.  Review of Systems  Please see the history of present illness.    All other systems reviewed and are otherwise negative except as noted above.  Physical Exam    Wt Readings from Last 3 Encounters:  11/29/23 211 lb 9.6 oz (96 kg)  09/11/23 222 lb 14.2 oz (101.1 kg)  11/13/22 222 lb 12.8 oz (101.1 kg)   VS: Vitals:   11/29/23 1452  BP: 126/84  Pulse: 61  SpO2: 96%  ,Body mass index is 33.14 kg/m. GEN: Well nourished, well developed in no acute distress Neck: No JVD; No carotid bruits Pulmonary: Clear to auscultation without rales, wheezing or rhonchi  Cardiovascular: Normal rate. Regular rhythm. Normal S1. Normal S2.   Murmurs: There is no murmur.  ABDOMEN: Soft, non-tender, non-distended EXTREMITIES:  No edema; No deformity   EKG/LABS/ Recent Cardiac Studies   ECG personally reviewed by me today -none completed today  Risk Assessment/Calculations:    CHA2DS2-VASc Score = 1   This indicates a 0.6%  annual risk of stroke. The patient's score is based upon: CHF History: 0 HTN History: 1 Diabetes History: 0 Stroke History: 0 Vascular Disease History: 0 Age Score: 0 Gender Score: 0          Lab Results  Component Value Date   WBC 3.9 (L) 09/14/2023   HGB 13.8 09/14/2023   HCT 40.3 09/14/2023   MCV 85.0 09/14/2023   PLT 164 09/14/2023   Lab Results  Component Value Date   CREATININE 0.87 09/15/2023   BUN 10 09/15/2023   NA 136 09/15/2023   K 4.0 09/15/2023   CL 103 09/15/2023   CO2 25 09/15/2023   Lab Results  Component Value Date   CHOL 99 (L) 01/13/2022   HDL 42 01/13/2022   LDLCALC 34 01/13/2022   TRIG 131 01/13/2022   CHOLHDL 2.4 01/13/2022    Lab Results  Component Value Date   HGBA1C (H) 06/18/2010    6.2 (NOTE)                                                                       According to the ADA Clinical Practice Recommendations for 2011, when HbA1c is used as a screening test:   >=6.5%   Diagnostic of Diabetes Mellitus           (if abnormal result  is confirmed)  5.7-6.4%   Increased risk of developing Diabetes Mellitus  References:Diagnosis and Classification of Diabetes Mellitus,Diabetes Care,2011,34(Suppl 1):S62-S69 and Standards of Medical Care in         Diabetes - 2011,Diabetes Care,2011,34  (Suppl 1):S11-S61.   Assessment & Plan    1.  History of AF: -Currently on flecainide 50 mg twice daily and Toprol-XL 25 mg daily for rate control -She reports no recurrence or episodes of AF since previous follow-up. -Patient's CHA2DS2-VASc score is a 1 and is currently on ASA 81 mg  2.  Nonobstructive CAD: -s/p LHC in 2011 showing minimal CAD -Today patient reports no chest pain or angina since previous follow-up. -Continue ASA 81 mg, Lipitor 20 mg, Repatha 140 mg/mL q. 14 days, fish oil 1200 mg twice daily  3.  Hyperlipidemia: -Patient's last LDL cholesterol was stable at 34 -Continue Lipitor 20 mg daily, fish oil 1200 mg twice daily, Repatha 140  mg/mL q. 14 days  4.  Primary HTN: -Patient's blood pressure today was well-controlled at 126/84 -Continue Toprol-XL 25 mg daily  Disposition: Follow-up with Dietrich Pates, MD or APP in 12 months    Signed, Napoleon Form, Leodis Rains, NP 11/29/2023, 4:55 PM [facility]

## 2023-11-29 ENCOUNTER — Ambulatory Visit: Payer: Federal, State, Local not specified - PPO | Attending: Nurse Practitioner | Admitting: Nurse Practitioner

## 2023-11-29 ENCOUNTER — Encounter: Payer: Self-pay | Admitting: Nurse Practitioner

## 2023-11-29 VITALS — BP 126/84 | HR 61 | Ht 67.0 in | Wt 211.6 lb

## 2023-11-29 DIAGNOSIS — I10 Essential (primary) hypertension: Secondary | ICD-10-CM

## 2023-11-29 DIAGNOSIS — I480 Paroxysmal atrial fibrillation: Secondary | ICD-10-CM

## 2023-11-29 DIAGNOSIS — I2510 Atherosclerotic heart disease of native coronary artery without angina pectoris: Secondary | ICD-10-CM

## 2023-11-29 DIAGNOSIS — E782 Mixed hyperlipidemia: Secondary | ICD-10-CM | POA: Diagnosis not present

## 2023-11-29 NOTE — Patient Instructions (Signed)
 Follow-Up: At [facility], you and your health needs are our priority.  As part of our continuing mission to provide you with exceptional heart care, we have created designated Provider Care Teams.  These Care Teams include your primary Cardiologist (physician) and Advanced Practice Providers (APPs -  Physician Assistants and Nurse Practitioners) who all work together to provide you with the care you need, when you need it.  We recommend signing up for the patient portal called "MyChart".  Sign up information is provided on this After Visit Summary.  MyChart is used to connect with patients for Virtual Visits (Telemedicine).  Patients are able to view lab/test results, encounter notes, upcoming appointments, etc.  Non-urgent messages can be sent to your provider as well.   To learn more about what you can do with MyChart, go to [email or web address].    Your next appointment:   1 year(s)  Provider:   Dietrich Pates, MD     Other Instructions   1st Floor: - Lobby - Registration  - Pharmacy  - Lab - Cafe  2nd Floor: - PV Lab - Diagnostic Testing (echo, CT, nuclear med)  3rd Floor: - Vacant  4th Floor: - TCTS (cardiothoracic surgery) - AFib Clinic - Structural Heart Clinic - Vascular Surgery  - Vascular Ultrasound  5th Floor: - HeartCare Cardiology (general and EP) - Clinical Pharmacy for coumadin, hypertension, lipid, weight-loss medications, and med management appointments    Valet parking services will be available as well.

## 2024-02-13 ENCOUNTER — Other Ambulatory Visit: Payer: Self-pay | Admitting: Internal Medicine

## 2024-02-17 ENCOUNTER — Other Ambulatory Visit: Payer: Self-pay | Admitting: Internal Medicine

## 2024-02-17 DIAGNOSIS — E782 Mixed hyperlipidemia: Secondary | ICD-10-CM

## 2024-02-17 DIAGNOSIS — I2510 Atherosclerotic heart disease of native coronary artery without angina pectoris: Secondary | ICD-10-CM

## 2024-05-12 ENCOUNTER — Other Ambulatory Visit: Payer: Self-pay | Admitting: Internal Medicine

## 2024-05-12 DIAGNOSIS — E782 Mixed hyperlipidemia: Secondary | ICD-10-CM

## 2024-05-12 DIAGNOSIS — I2510 Atherosclerotic heart disease of native coronary artery without angina pectoris: Secondary | ICD-10-CM

## 2024-06-27 ENCOUNTER — Telehealth: Payer: Self-pay | Admitting: Pharmacy Technician

## 2024-06-27 NOTE — Telephone Encounter (Signed)
    Pharmacy Patient Advocate Encounter   Received notification from CoverMyMeds that prior authorization for REPATHA  is required/requested.   Insurance verification completed.   The patient is insured through [employer] [facility] .   Per test claim: PA required; PA submitted to above mentioned insurance via Latent Key/confirmation #/EOC [id number] Status is pending

## 2024-06-28 NOTE — Telephone Encounter (Signed)
 Pharmacy Patient Advocate Encounter  Received notification from [employer] [facility] that Prior Authorization for repatha  has been APPROVED from 05/28/24 to 06/27/25   PA #/Case ID/Reference #: [id number]

## 2024-11-24 ENCOUNTER — Ambulatory Visit: Admitting: Physician Assistant
